# Patient Record
Sex: Female | Born: 1960 | Race: White | Hispanic: No | Marital: Married | Smoking: Never smoker
Health system: Southern US, Community
[De-identification: ages and names within clinical notes are randomized; demographics above are authoritative.]

## PROBLEM LIST (undated history)

## (undated) DIAGNOSIS — H9192 Unspecified hearing loss, left ear: Secondary | ICD-10-CM

## (undated) DIAGNOSIS — M1990 Unspecified osteoarthritis, unspecified site: Secondary | ICD-10-CM

## (undated) DIAGNOSIS — Z9889 Other specified postprocedural states: Secondary | ICD-10-CM

## (undated) DIAGNOSIS — L309 Dermatitis, unspecified: Secondary | ICD-10-CM

## (undated) DIAGNOSIS — R112 Nausea with vomiting, unspecified: Secondary | ICD-10-CM

## (undated) DIAGNOSIS — L509 Urticaria, unspecified: Secondary | ICD-10-CM

## (undated) DIAGNOSIS — I8390 Asymptomatic varicose veins of unspecified lower extremity: Secondary | ICD-10-CM

## (undated) DIAGNOSIS — J189 Pneumonia, unspecified organism: Secondary | ICD-10-CM

## (undated) DIAGNOSIS — K469 Unspecified abdominal hernia without obstruction or gangrene: Secondary | ICD-10-CM

## (undated) DIAGNOSIS — Z889 Allergy status to unspecified drugs, medicaments and biological substances status: Secondary | ICD-10-CM

## (undated) HISTORY — PX: CATARACT EXTRACTION: SUR2

## (undated) HISTORY — PX: ADENOIDECTOMY: SUR15

## (undated) HISTORY — DX: Dermatitis, unspecified: L30.9

## (undated) HISTORY — PX: APPENDECTOMY: SHX54

## (undated) HISTORY — PX: TYMPANOSTOMY TUBE PLACEMENT: SHX32

## (undated) HISTORY — PX: TONSILLECTOMY: SUR1361

## (undated) HISTORY — PX: OTHER SURGICAL HISTORY: SHX169

## (undated) HISTORY — PX: TUBAL LIGATION: SHX77

## (undated) HISTORY — DX: Urticaria, unspecified: L50.9

---

## 2000-01-03 ENCOUNTER — Other Ambulatory Visit: Admission: RE | Admit: 2000-01-03 | Discharge: 2000-01-03 | Payer: Self-pay | Admitting: *Deleted

## 2004-07-15 ENCOUNTER — Emergency Department (HOSPITAL_COMMUNITY): Admission: EM | Admit: 2004-07-15 | Discharge: 2004-07-15 | Payer: Self-pay | Admitting: Family Medicine

## 2004-07-15 IMAGING — CR Imaging study
3 series · 3 of 3 positions shown · non-contrast
Comparison: none

CLINICAL DATA: 43-year-old with left foot injury.  Patient twisted foot this morning.  Patient has dorsal pain and swelling.  
 LEFT FOOT ? 3 VIEWS:
 Three views of the left foot demonstrate soft tissue edema along dorsum of the hind foot.  However no evidence for acute fracture or dislocation.  No metallic foreign body identified.

[view not recorded (1 of 3)]
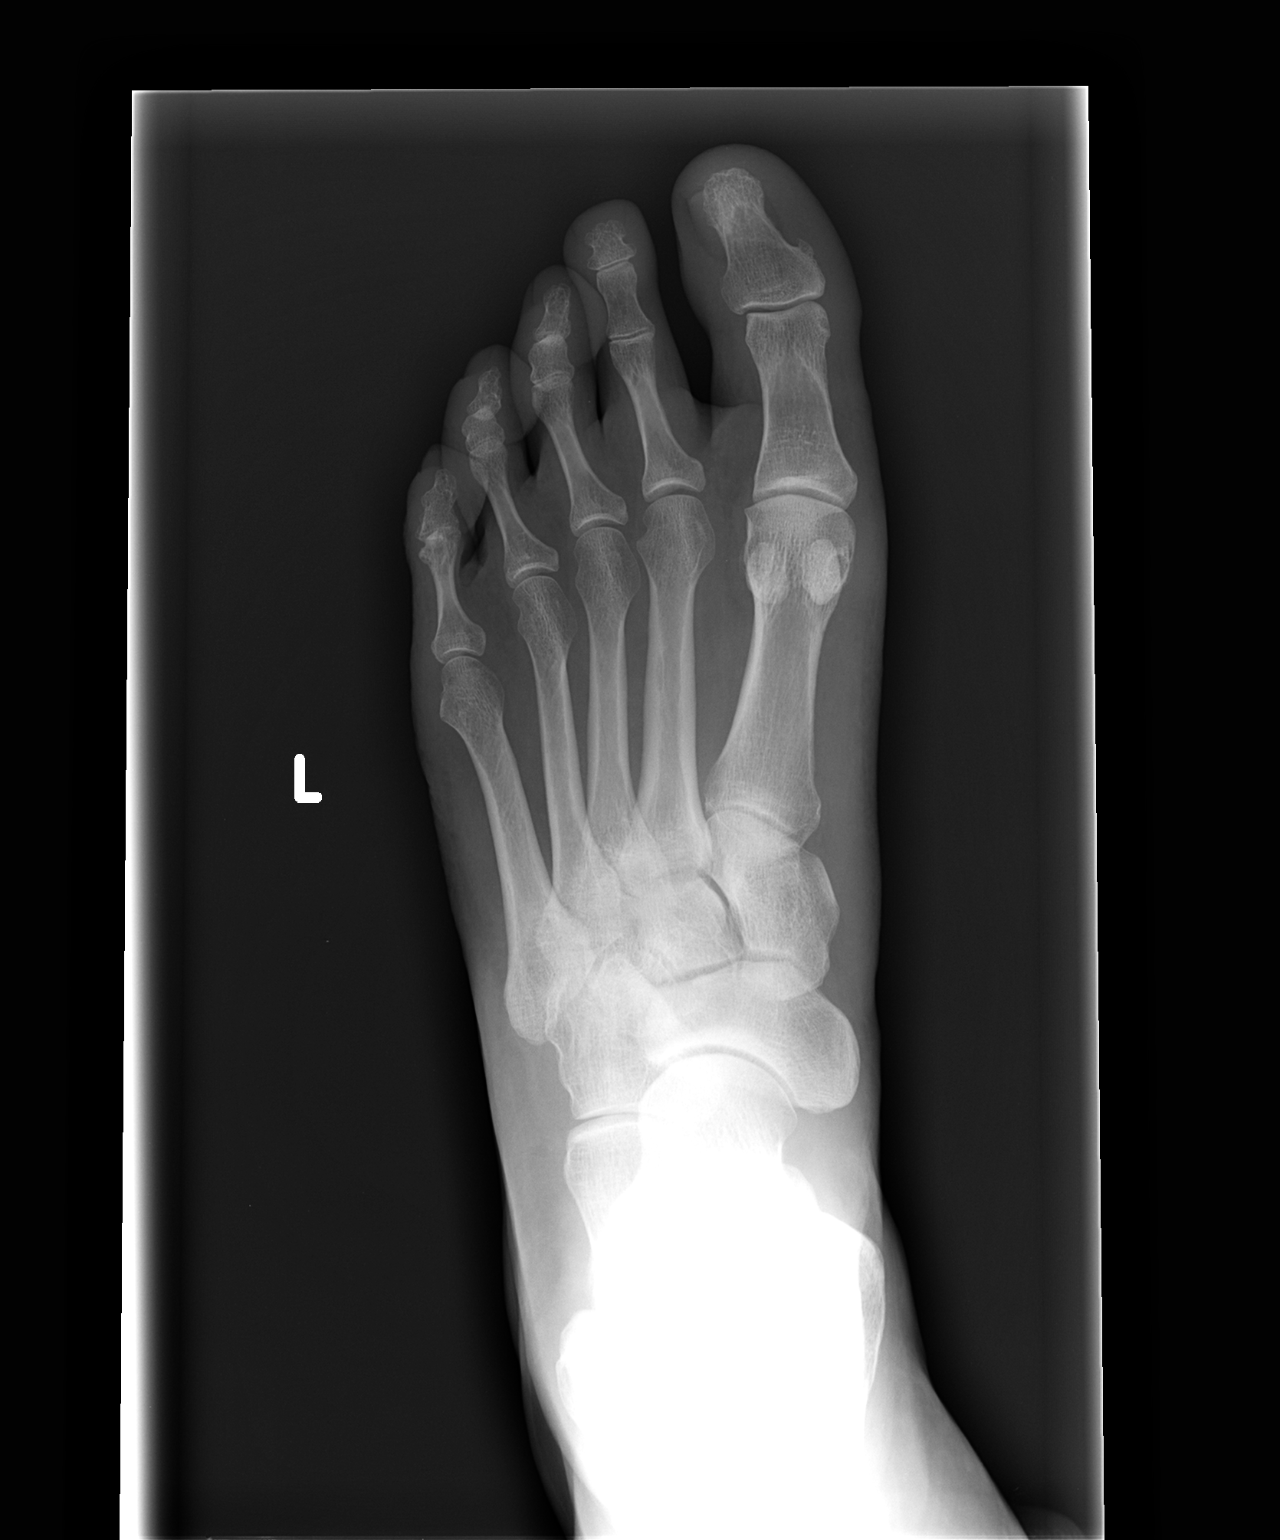

[view not recorded (2 of 3)]
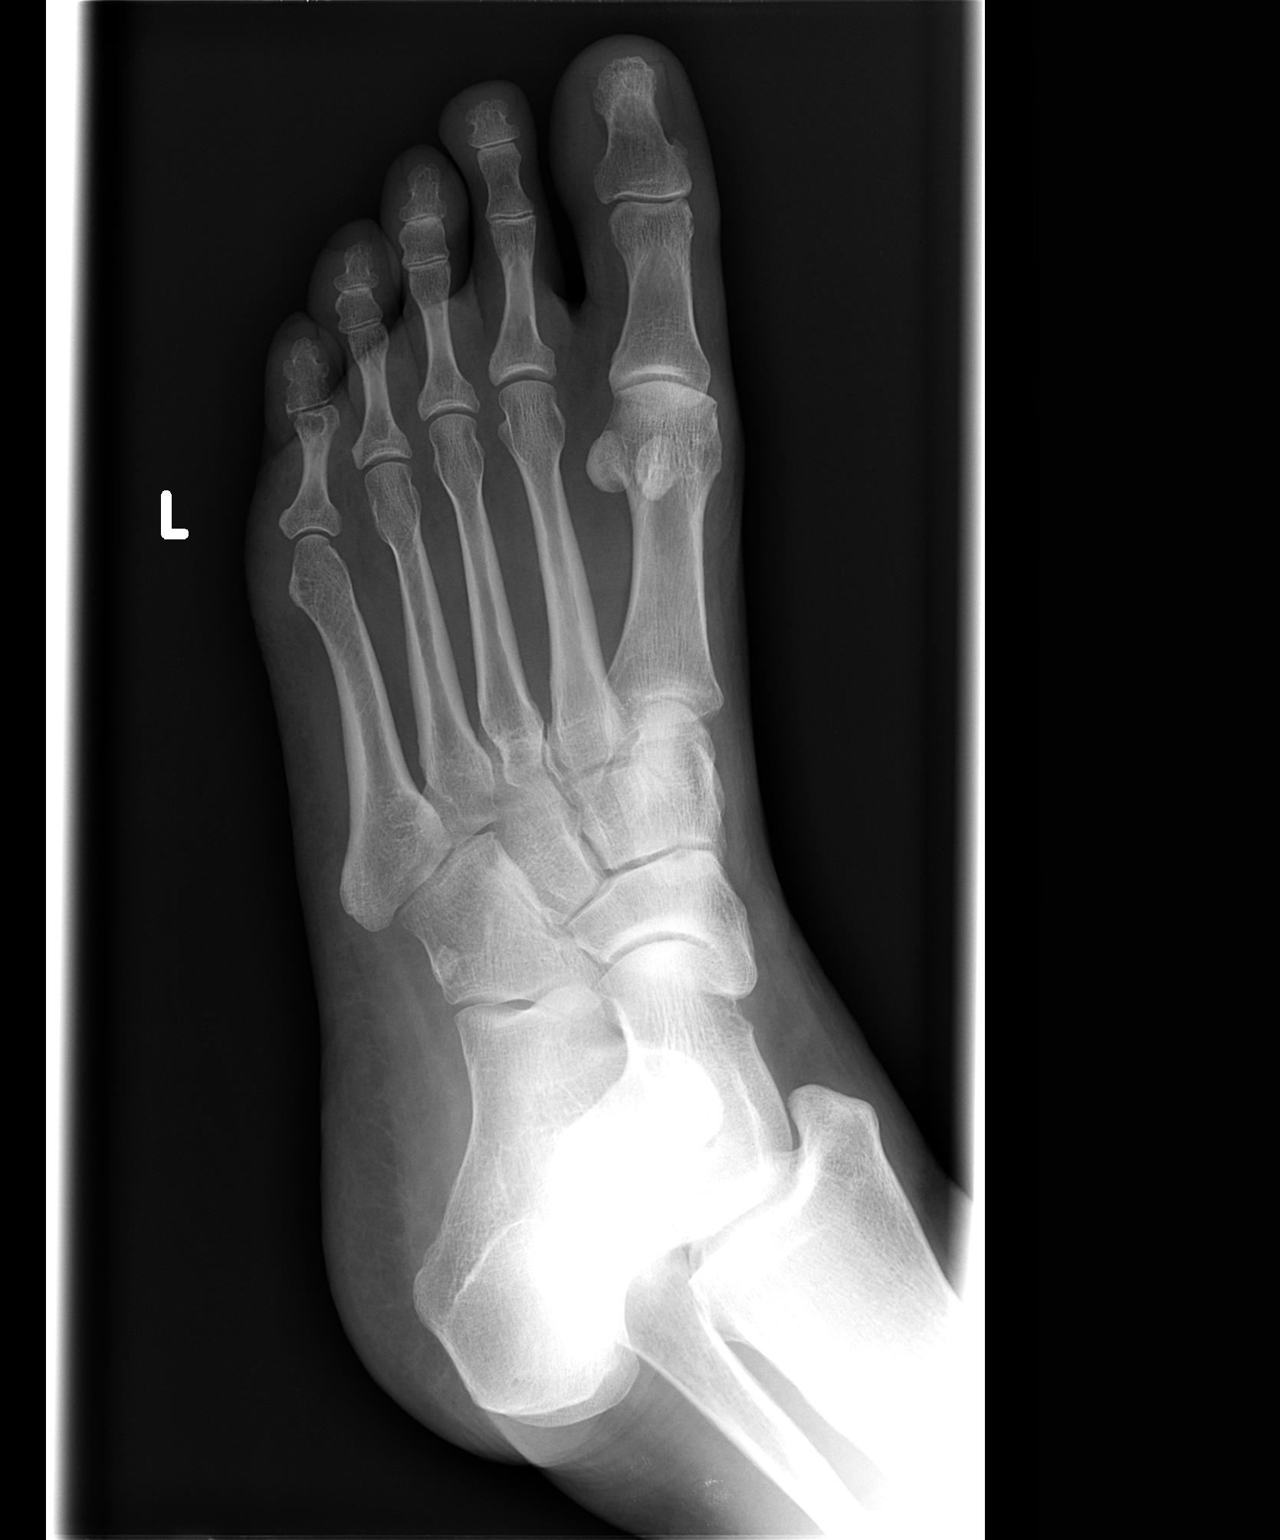

[view not recorded (3 of 3)]
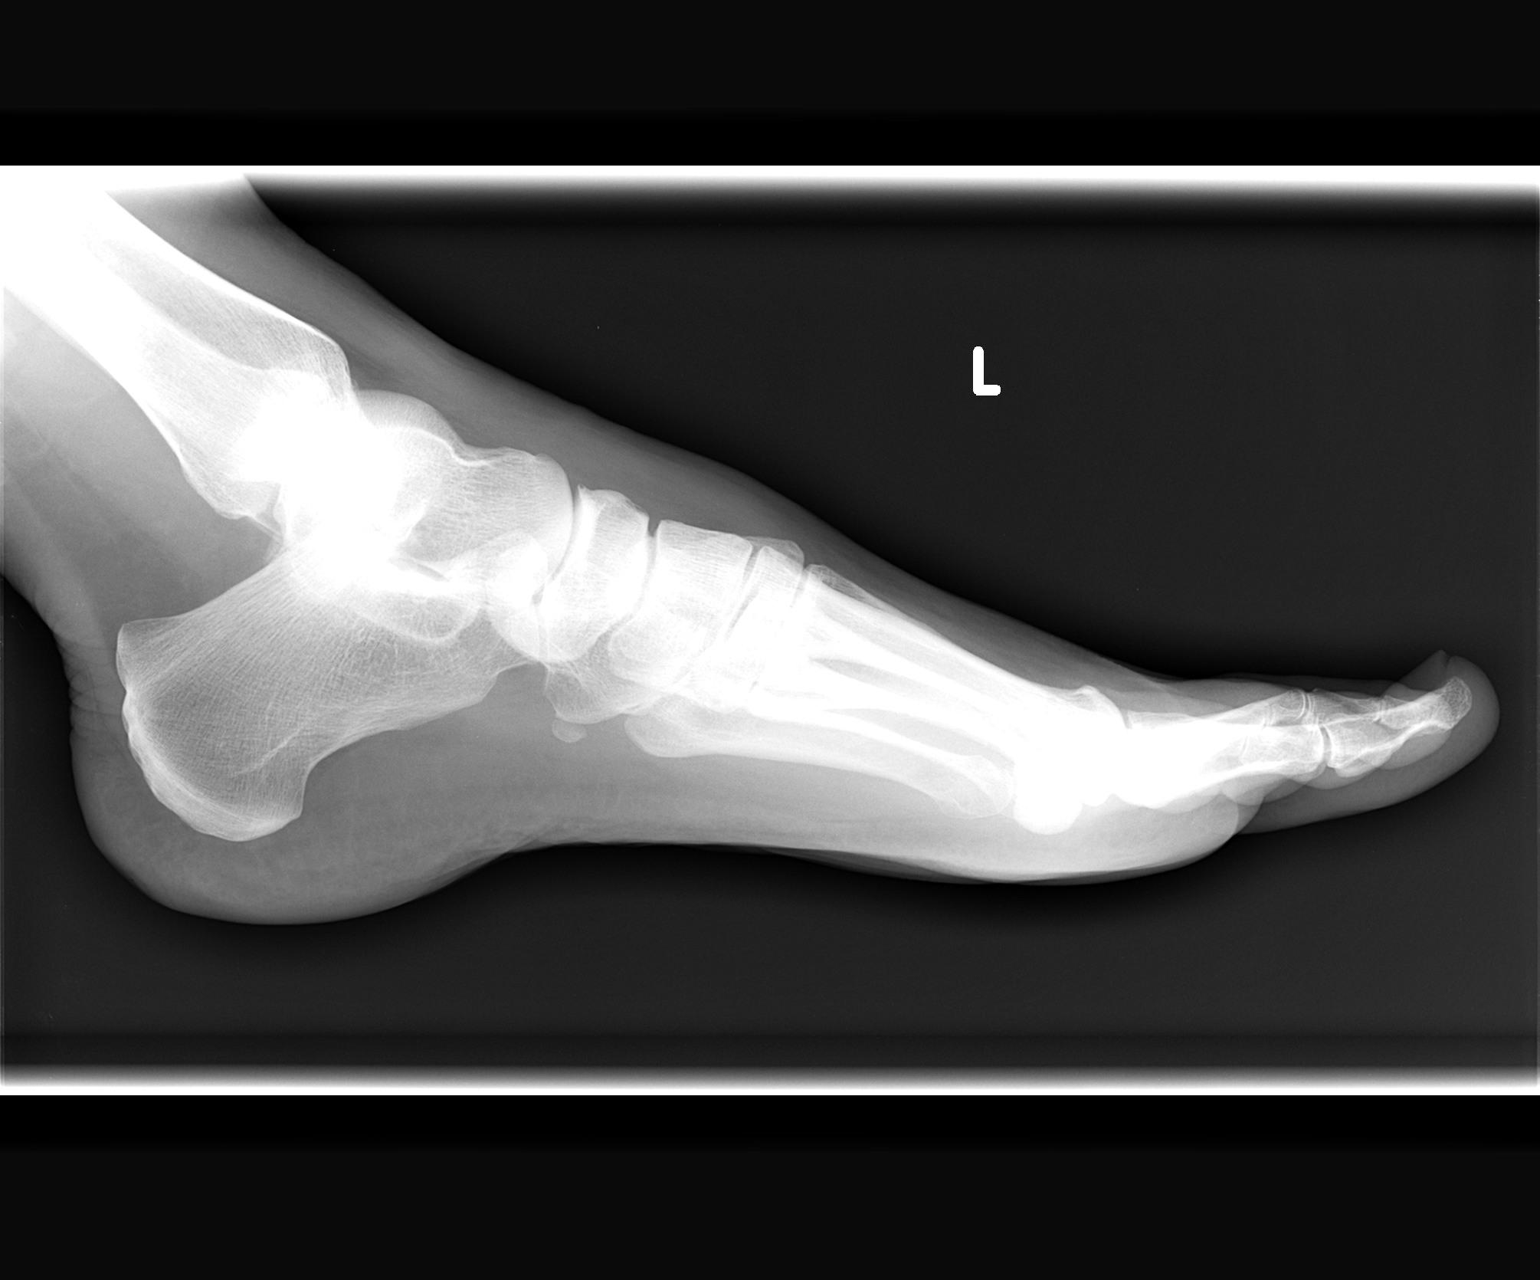

[3 of 3 positions shown; findings below may reference images not displayed]

IMPRESSION: 1.  No evidence for acute bony abnormality. 
 2.  Soft tissue swelling along the dorsum of the hind foot.

## 2004-10-30 ENCOUNTER — Other Ambulatory Visit: Admission: RE | Admit: 2004-10-30 | Discharge: 2004-10-30 | Payer: Self-pay | Admitting: Obstetrics and Gynecology

## 2006-07-16 ENCOUNTER — Other Ambulatory Visit: Admission: RE | Admit: 2006-07-16 | Discharge: 2006-07-16 | Payer: Self-pay | Admitting: Family Medicine

## 2007-11-23 ENCOUNTER — Emergency Department (HOSPITAL_COMMUNITY): Admission: EM | Admit: 2007-11-23 | Discharge: 2007-11-23 | Payer: Self-pay | Admitting: Emergency Medicine

## 2012-03-31 ENCOUNTER — Emergency Department (HOSPITAL_COMMUNITY)
Admission: EM | Admit: 2012-03-31 | Discharge: 2012-04-01 | Disposition: A | Discharge status: Home / Self Care | Payer: BC Managed Care – PPO | Attending: Emergency Medicine | Admitting: Emergency Medicine

## 2012-03-31 ENCOUNTER — Emergency Department (HOSPITAL_COMMUNITY)
Admission: EM | Admit: 2012-03-31 | Discharge: 2012-03-31 | Disposition: A | Discharge status: Home / Self Care | Payer: Self-pay

## 2012-03-31 ENCOUNTER — Encounter (HOSPITAL_COMMUNITY): Payer: Self-pay | Admitting: *Deleted

## 2012-03-31 DIAGNOSIS — Z8719 Personal history of other diseases of the digestive system: Secondary | ICD-10-CM | POA: Insufficient documentation

## 2012-03-31 DIAGNOSIS — R1110 Vomiting, unspecified: Secondary | ICD-10-CM | POA: Insufficient documentation

## 2012-03-31 DIAGNOSIS — Z9089 Acquired absence of other organs: Secondary | ICD-10-CM | POA: Insufficient documentation

## 2012-03-31 DIAGNOSIS — R109 Unspecified abdominal pain: Secondary | ICD-10-CM | POA: Insufficient documentation

## 2012-03-31 HISTORY — DX: Unspecified abdominal hernia without obstruction or gangrene: K46.9

## 2012-03-31 LAB — CBC WITH DIFFERENTIAL/PLATELET
Basophils Absolute: 0 10*3/uL (ref 0.0–0.1)
Hemoglobin: 12.8 g/dL (ref 12.0–15.0)
Lymphs Abs: 1 10*3/uL (ref 0.7–4.0)
MCH: 32 pg (ref 26.0–34.0)
Monocytes Relative: 5 % (ref 3–12)
Neutro Abs: 7.5 10*3/uL (ref 1.7–7.7)
Neutrophils Relative %: 82 % — ABNORMAL HIGH (ref 43–77)
RBC: 4 MIL/uL (ref 3.87–5.11)
RDW: 13.1 % (ref 11.5–15.5)
WBC: 9.1 10*3/uL (ref 4.0–10.5)

## 2012-03-31 LAB — HEPATIC FUNCTION PANEL: Bilirubin, Direct: <0.1 mg/dL (ref 0.0–0.3)

## 2012-03-31 LAB — LIPASE, BLOOD: Lipase: 35 U/L (ref 11–59)

## 2012-03-31 LAB — BASIC METABOLIC PANEL
BUN: 24 mg/dL — ABNORMAL HIGH (ref 6–23)
CO2: 26 mEq/L (ref 19–32)
Creatinine, Ser: 0.86 mg/dL (ref 0.50–1.10)
GFR calc Af Amer: 88 mL/min — ABNORMAL LOW (ref >90)
Glucose, Bld: 153 mg/dL — ABNORMAL HIGH (ref 70–99)

## 2012-03-31 MED ORDER — MORPHINE SULFATE 4 MG/ML IJ SOLN
4.0000 mg | Freq: Once | INTRAMUSCULAR | Status: AC
  Administered 2012-03-31: 4 mg via INTRAVENOUS
  Filled 2012-03-31: qty 1

## 2012-03-31 MED ORDER — ONDANSETRON HCL 4 MG/2ML IJ SOLN
4.0000 mg | Freq: Once | INTRAMUSCULAR | Status: AC
  Administered 2012-03-31: 4 mg via INTRAVENOUS
  Filled 2012-03-31: qty 2

## 2012-03-31 MED ORDER — GI COCKTAIL ~~LOC~~
30.0000 mL | Freq: Once | ORAL | Status: AC
  Administered 2012-04-01: 30 mL via ORAL
  Filled 2012-03-31: qty 30

## 2012-03-31 MED ORDER — SODIUM CHLORIDE 0.9 % IV BOLUS (SEPSIS)
1000.0000 mL | Freq: Once | INTRAVENOUS | Status: AC
  Administered 2012-03-31: 1000 mL via INTRAVENOUS

## 2012-03-31 MED ORDER — PANTOPRAZOLE SODIUM 40 MG IV SOLR
40.0000 mg | Freq: Once | INTRAVENOUS | Status: AC
  Administered 2012-04-01: 40 mg via INTRAVENOUS
  Filled 2012-03-31: qty 40

## 2012-03-31 NOTE — ED Notes (Signed)
Pt reports generalized abd pain that began approx 1900 - pt states she has hx of a hernia and usually the pain spontaneously subsided however tonight the pain will not subside - pt admits to x2 episodes of vomiting denies diarrhea.

## 2012-03-31 NOTE — ED Provider Notes (Signed)
History     CSN: [id number]  Arrival date & time 03/31/12  2133   None     Chief Complaint  Patient presents with  . Abdominal Pain  . Emesis    (Consider location/radiation/quality/duration/timing/severity/associated sxs/prior treatment) HPI History provided by pt.   Pt developed severe peri-umbilical pain that radiated around both sides to her back and was associated w/ 3 episodes of vomiting, immediately after dinner tonight.  Ate a cheeseburger.  Had similar sx one week ago, but pain worse today.  No aggravating/alleviating factors.  Has not had fever, hematemesis/hematochezia/melena, urinary or vaginal sx.  Had a nml BM this morning.  H/o appendectomy.  Has a chronic L inguinal hernia.  Noticed that it had popped out after vomiting today, but it has reduced spontaneously in ED.  Abdominal pain currently improved.  Past Medical History  Diagnosis Date  . Hernia     Past Surgical History  Procedure Laterality Date  . Appendectomy      History reviewed. No pertinent family history.  History  Substance Use Topics  . Smoking status: Never Smoker   . Smokeless tobacco: Not on file  . Alcohol Use: No    OB History   Grav Para Term Preterm Abortions TAB SAB Ect Mult Living                  Review of Systems  All other systems reviewed and are negative.    Allergies  Demerol  Home Medications   Current Outpatient Rx  Name  Route  Sig  Dispense  Refill  . acetaminophen (TYLENOL) 325 MG tablet   Oral   Take 650 mg by mouth every 6 (six) hours as needed for pain. Pain           BP 148/58  Pulse 70  Temp(Src) 98 F (36.7 C) (Oral)  Resp 22  Ht 5\' 11"  (1.803 m)  Wt 164 lb (74.39 kg)  BMI 22.88 kg/m2  SpO2 100%  Physical Exam  Nursing note and vitals reviewed. Constitutional: She is oriented to person, place, and time. She appears well-developed and well-nourished. No distress.  HENT:  Head: Normocephalic and atraumatic.  Eyes:  Normal appearance   Neck: Normal range of motion.  Cardiovascular: Normal rate and regular rhythm.   Pulmonary/Chest: Effort normal and breath sounds normal. No respiratory distress.  Abdominal: Soft. Bowel sounds are normal. She exhibits no distension and no mass. There is tenderness. There is guarding. There is no rebound.  Epigastric and periumbilical ttp w/ guarding.  Negative murphy's sign.   Genitourinary:  No CVA tenderness  Musculoskeletal: Normal range of motion.  Neurological: She is alert and oriented to person, place, and time.  Skin: Skin is warm and dry. No rash noted.  Psychiatric: She has a normal mood and affect. Her behavior is normal.    ED Course  Procedures (including critical care time)  Labs Reviewed  URINALYSIS, MICROSCOPIC ONLY - Abnormal; Notable for the following:    Hgb urine dipstick TRACE (*)    Ketones, ur TRACE (*)    All other components within normal limits  CBC WITH DIFFERENTIAL - Abnormal; Notable for the following:    Neutrophils Relative 82 (*)    Lymphocytes Relative 11 (*)    All other components within normal limits  BASIC METABOLIC PANEL - Abnormal; Notable for the following:    Glucose, Bld 153 (*)    BUN 24 (*)    GFR calc non Af Amer 76 (*)  GFR calc Af Amer 88 (*)    All other components within normal limits  HEPATIC FUNCTION PANEL  LIPASE, BLOOD   US Abdomen Complete  04/01/2012  *RADIOLOGY REPORT*  Clinical Data:  52 year old female with abdominal pain.  ABDOMINAL ULTRASOUND COMPLETE  Comparison:  None  Findings:  Gallbladder:  The gallbladder is unremarkable. There is no evidence of gallstones, gallbladder wall thickening, or pericholecystic fluid.  Common Bile Duct:  There is no evidence of intrahepatic or extrahepatic biliary dilation. The CBD measures 4.4 mm in greatest diameter.  Liver:  The liver is within normal limits in parenchymal echogenicity. No focal abnormalities are identified.  IVC:  Appears normal.  Pancreas:  Although the pancreas  is difficult to visualize in its entirety, no focal pancreatic abnormality is identified.  Spleen:  Within normal limits in size and echotexture.  Right kidney:  The right kidney is normal in size and parenchymal echogenicity.  There is no evidence of solid mass, hydronephrosis or definite renal calculi.  The right kidney measures 11.4 cm.  Left kidney:  The left kidney is normal in size and parenchymal echogenicity.  There is no evidence of solid mass, hydronephrosis or definite renal calculi.   The left kidney measures 10.7 cm.  Abdominal Aorta:  No abdominal aortic aneurysm identified.  There is no evidence of ascites.  IMPRESSION: Negative abdominal ultrasound.   Original Report Authenticated By: Harmon Pier, M.D.      1. Abdominal pain       MDM  51yo F presents w/ severe, post-prandial, peri-umbilical pain w/ N/V that started this evening.  H/o L inguinal hernia and appendectomy.  On exam, afebrile, NAD, periumbilical and epigastric ttp w/ guarding but no peritoneal signs/negative murphy's, and hernia is not palpable.  Labs pending and abd [place] ordered to r/o cholelithiasis.  Sx improved spontaneously but gi cocktail, IV protonix, morphine, zofran and fluids ordered.  Will reassess shortly.  11:58 PM   Pt reports that her pain and nausea have resolved.  She has clarified that the pain seemed to improve when her hernia reduced spontaneously here in ED.  She was not able to manually reduce prior to arrival.  Same occurred when she had this pain last week.  On repeat exam, mild tenderness at RLQ only and hernia is not palpable.   Labs and US unremarkable.  Results discussed w/ pt.  Will po challenge and then d/c home w/ referral to [facility] as well as vicodin and zofran.  Strict return precautions discussed. 2:18 AM         Otilio Miu, PA-C 04/01/12 [phone]

## 2012-03-31 NOTE — ED Notes (Signed)
PA at bedside.

## 2012-04-01 ENCOUNTER — Emergency Department (HOSPITAL_COMMUNITY): Payer: BC Managed Care – PPO

## 2012-04-01 LAB — URINALYSIS, MICROSCOPIC ONLY
Bilirubin Urine: NEGATIVE
Leukocytes, UA: NEGATIVE
Nitrite: NEGATIVE
Specific Gravity, Urine: 1.024 (ref 1.005–1.030)

## 2012-04-01 MED ORDER — HYDROCODONE-ACETAMINOPHEN 5-325 MG PO TABS: 1.0000 | ORAL_TABLET | ORAL | Status: DC | PRN

## 2012-04-01 MED ORDER — ONDANSETRON HCL 8 MG PO TABS: 8.0000 mg | ORAL_TABLET | Freq: Three times a day (TID) | ORAL | Status: DC | PRN

## 2012-04-02 ENCOUNTER — Ambulatory Visit (INDEPENDENT_AMBULATORY_CARE_PROVIDER_SITE_OTHER): Payer: BC Managed Care – PPO | Admitting: General Surgery

## 2012-04-02 ENCOUNTER — Encounter (INDEPENDENT_AMBULATORY_CARE_PROVIDER_SITE_OTHER): Payer: Self-pay | Admitting: General Surgery

## 2012-04-02 VITALS — BP 136/72 | HR 64 | Temp 98.5°F | Resp 12 | Ht 71.0 in | Wt 169.0 lb

## 2012-04-02 DIAGNOSIS — K4090 Unilateral inguinal hernia, without obstruction or gangrene, not specified as recurrent: Secondary | ICD-10-CM

## 2012-04-02 NOTE — Progress Notes (Signed)
Patient ID: Brenda Lynn, female   DOB: [birth year], 52 y.o.   MRN: [id number]  Chief Complaint  Patient presents with  . New Evaluation    eval LIH - very painful    HPI Brenda Lynn is a 52 y.o. female.  This patient is referred by Dr. Nicki Reaper the emergency room for evaluation of a left inguinal hernia. She is at a known inguinal hernia for several years since the birth of her son but it has only cause 3 episodes of discomfort. It normally is asymptomatic and she notices a small bulge periodically but even when it bulges out it doesn't cause any discomfort. She says that 3 episodes have occurred where she had a bulging in the groin and she was unable to reduce the bulge which causes excruciating pain.  These episodes were accompanied with nausea and vomiting as well and relieved after short period of time. Otherwise she denies any obstructive symptoms HPI  Past Medical History  Diagnosis Date  . Hernia     Past Surgical History  Procedure Laterality Date  . Appendectomy    . Tubal ligation    . Tympanostomy tube placement      Family History  Problem Relation Age of Onset  . Cancer Father     leukemia  . Cancer Paternal Aunt     breast  . Cancer Maternal Grandfather     unknown to pt / ?skin    Social History History  Substance Use Topics  . Smoking status: Never Smoker   . Smokeless tobacco: Never Used  . Alcohol Use: No    Allergies  Allergen Reactions  . Demerol (Meperidine) Nausea Only    Current Outpatient Prescriptions  Medication Sig Dispense Refill  . ibuprofen (ADVIL,MOTRIN) 100 MG chewable tablet Chew 100 mg by mouth every 8 (eight) hours as needed for fever.      Marland Kitchen acetaminophen (TYLENOL) 325 MG tablet Take 650 mg by mouth every 6 (six) hours as needed for pain. Pain       No current facility-administered medications for this visit.    Review of Systems Review of Systems All other review of systems negative or noncontributory except as stated in the  HPI  Blood pressure 136/72, pulse 64, temperature 98.5 F (36.9 C), temperature source Temporal, resp. rate 12, height 5\' 11"  (1.803 m), weight 169 lb (76.658 kg).  Physical Exam Physical Exam Physical Exam  Nursing note and vitals reviewed. Constitutional: She is oriented to person, place, and time. She appears well-developed and well-nourished. No distress.  HENT:  Head: Normocephalic and atraumatic.  Mouth/Throat: No oropharyngeal exudate.  Eyes: Conjunctivae and EOM are normal. Pupils are equal, round, and reactive to light. Right eye exhibits no discharge. Left eye exhibits no discharge. No scleral icterus.  Neck: Normal range of motion. Neck supple. No tracheal deviation present.  Cardiovascular: Normal rate, regular rhythm, normal heart sounds and intact distal pulses.   Pulmonary/Chest: Effort normal and breath sounds normal. No stridor. No respiratory distress. She has no wheezes.  Abdominal: Soft. Bowel sounds are normal. She exhibits no distension and no mass. There is no tenderness. There is no rebound and no guarding.  Small left inguinal hernia, no RIH Musculoskeletal: Normal range of motion. She exhibits no edema and no tenderness.  Neurological: She is alert and oriented to person, place, and time.  Skin: Skin is warm and dry. No rash noted. She is not diaphoretic. No erythema. No pallor.  Psychiatric: She has a normal mood  and affect. Her behavior is normal. Judgment and thought content normal.    Data Reviewed   Assessment    A small left inguinal hernia She has a small left inguinal hernia on exam and a history consistent with inguinal hernia as well. She does work for [employer] and does a lot of distinct which could certainly exacerbate her problem. We discussed the options for repair of both laparoscopic and open repair I think that open repair would probably be the best option for this case. We discussed the option of infection, bleeding, pain, scarring, recurrence,  persistent symptoms, nerve injury and chronic pain. She would like to proceed with open LIH with mesh    Plan    We will set her up for open LIH at her convenience        Lodema Pilot DAVID 04/02/2012, 3:15 PM

## 2012-04-02 NOTE — ED Provider Notes (Signed)
Medical screening examination/treatment/procedure(s) were performed by non-physician practitioner and as supervising physician I was immediately available for consultation/collaboration.    Janequa Kipnis R Kayven Aldaco, MD 04/02/12 2208 

## 2012-04-29 ENCOUNTER — Telehealth (INDEPENDENT_AMBULATORY_CARE_PROVIDER_SITE_OTHER): Payer: Self-pay | Admitting: General Surgery

## 2012-04-29 NOTE — Telephone Encounter (Signed)
Patient also wanted to let us know that she has a h/o keloid development.

## 2012-04-29 NOTE — Telephone Encounter (Signed)
Called patient back after speaking with Dr. Biagio Quint and I informed her that he explained he only recalled one on the left side but that he would check her at the time of surgery and make a decision based on the evidence.  She was okay with this.

## 2012-04-29 NOTE — Telephone Encounter (Signed)
Patient called in to get a littler more information about the mesh.  She was worried that it had metal components, informed her that it did not.  She also explained that she has a BIH, but that Dr. Biagio Quint only has her scheduled for a single sided hernia repair and she wants to know if he thinks she should go ahead and have both sides repaired at the same time.  I explained that I would take this to Dr. Biagio Quint and that we would get back to her to let her know.

## 2012-05-01 ENCOUNTER — Encounter (HOSPITAL_COMMUNITY): Payer: Self-pay | Admitting: Pharmacy Technician

## 2012-05-02 NOTE — Telephone Encounter (Signed)
Please advise 

## 2012-05-05 ENCOUNTER — Other Ambulatory Visit (HOSPITAL_COMMUNITY): Payer: Self-pay | Admitting: *Deleted

## 2012-05-06 ENCOUNTER — Encounter (HOSPITAL_COMMUNITY)
Admission: RE | Admit: 2012-05-06 | Discharge: 2012-05-06 | Disposition: A | Discharge status: Home / Self Care | Payer: BC Managed Care – PPO | Source: Ambulatory Visit | Attending: General Surgery | Admitting: General Surgery

## 2012-05-06 ENCOUNTER — Encounter (HOSPITAL_COMMUNITY): Payer: Self-pay

## 2012-05-06 VITALS — BP 122/78 | HR 70 | Temp 98.2°F | Resp 18 | Ht 70.5 in | Wt 167.0 lb

## 2012-05-06 DIAGNOSIS — K4090 Unilateral inguinal hernia, without obstruction or gangrene, not specified as recurrent: Secondary | ICD-10-CM

## 2012-05-06 LAB — SURGICAL PCR SCREEN: MRSA, PCR: NEGATIVE

## 2012-05-06 LAB — CBC
HCT: 39.6 % (ref 36.0–46.0)
Hemoglobin: 13.3 g/dL (ref 12.0–15.0)
MCHC: 33.6 g/dL (ref 30.0–36.0)
MCV: 96.1 fL (ref 78.0–100.0)
WBC: 3.4 10*3/uL — ABNORMAL LOW (ref 4.0–10.5)

## 2012-05-06 NOTE — Patient Instructions (Addendum)
20 Shaylan Tutton  05/06/2012   Your procedure is scheduled on: 05-14-2012   Report to [facility] at 0630 AM.  Call this number if you have problems the morning of surgery [phone]   Remember:   Do not eat food or drink liquids :After Midnight.     Take these medicines the morning of surgery with A SIP OF WATER: no meds to take                                SEE [facility] PREPARING FOR SURGERY SHEET   Do not wear jewelry, make-up or nail polish.  Do not wear lotions, powders, or perfumes. You may wear deodorant.   Men may shave face and neck.  Do not bring valuables to the hospital.  Contacts, dentures or bridgework may not be worn into surgery.  Leave suitcase in the car. After surgery it may be brought to your room.  For patients admitted to the hospital, checkout time is 11:00 AM the day of discharge.   Patients discharged the day of surgery will not be allowed to drive home.  Name and phone number of your driver: caral whan spouse cell [phone]  Special Instructions: N/A   Please read over the following fact sheets that you were given: MRSA Information.  Call Cain Sieve RN pre op nurse if needed [phone]    FAILURE TO FOLLOW THESE INSTRUCTIONS MAY RESULT IN THE CANCELLATION OF YOUR SURGERY. PATIENT SIGNATURE___________________________________________

## 2012-05-13 NOTE — Anesthesia Preprocedure Evaluation (Addendum)
Anesthesia Evaluation  Patient identified by MRN, date of birth, ID band Patient awake    Reviewed: Allergy & Precautions, H&P , NPO status , Patient's Chart, lab work & pertinent test results  Airway Mallampati: II      Dental  (+) Teeth Intact and Dental Advisory Given   Pulmonary neg pulmonary ROS,  breath sounds clear to auscultation  Pulmonary exam normal       Cardiovascular negative cardio ROS  Rhythm:Regular Rate:Normal     Neuro/Psych negative neurological ROS  negative psych ROS   GI/Hepatic negative GI ROS, Neg liver ROS,   Endo/Other  negative endocrine ROS  Renal/GU negative Renal ROS  negative genitourinary   Musculoskeletal negative musculoskeletal ROS (+)   Abdominal   Peds  Hematology negative hematology ROS (+)   Anesthesia Other Findings   Reproductive/Obstetrics negative OB ROS                          Anesthesia Physical Anesthesia Plan  ASA: I  Anesthesia Plan: General   Post-op Pain Management:    Induction: Intravenous  Airway Management Planned: LMA  Additional Equipment:   Intra-op Plan:   Post-operative Plan: Extubation in OR  Informed Consent: I have reviewed the patients History and Physical, chart, labs and discussed the procedure including the risks, benefits and alternatives for the proposed anesthesia with the patient or authorized representative who has indicated his/her understanding and acceptance.   Dental advisory given  Plan Discussed with: CRNA  Anesthesia Plan Comments:         Anesthesia Quick Evaluation

## 2012-05-14 ENCOUNTER — Ambulatory Visit (HOSPITAL_COMMUNITY): Payer: BC Managed Care – PPO | Admitting: Anesthesiology

## 2012-05-14 ENCOUNTER — Ambulatory Visit (HOSPITAL_COMMUNITY)
Admission: RE | Admit: 2012-05-14 | Discharge: 2012-05-14 | Disposition: A | Discharge status: Home / Self Care | Payer: BC Managed Care – PPO | Source: Ambulatory Visit | Attending: General Surgery | Admitting: General Surgery

## 2012-05-14 ENCOUNTER — Encounter (HOSPITAL_COMMUNITY): Payer: Self-pay | Admitting: Anesthesiology

## 2012-05-14 ENCOUNTER — Encounter (HOSPITAL_COMMUNITY)
Admission: RE | Disposition: A | Discharge status: Home / Self Care | Payer: Self-pay | Source: Ambulatory Visit | Attending: General Surgery

## 2012-05-14 ENCOUNTER — Encounter (HOSPITAL_COMMUNITY): Payer: Self-pay

## 2012-05-14 DIAGNOSIS — K4090 Unilateral inguinal hernia, without obstruction or gangrene, not specified as recurrent: Secondary | ICD-10-CM | POA: Insufficient documentation

## 2012-05-14 DIAGNOSIS — Z01812 Encounter for preprocedural laboratory examination: Secondary | ICD-10-CM | POA: Insufficient documentation

## 2012-05-14 HISTORY — PX: INSERTION OF MESH: SHX5868

## 2012-05-14 HISTORY — PX: INGUINAL HERNIA REPAIR: SHX194

## 2012-05-14 SURGERY — REPAIR, HERNIA, INGUINAL, ADULT
Anesthesia: General | Site: Groin | Laterality: Left | Wound class: Clean

## 2012-05-14 MED ORDER — HYDROMORPHONE HCL PF 1 MG/ML IJ SOLN
0.2500 mg | INTRAMUSCULAR | Status: DC | PRN
  Administered 2012-05-14 (×2): 0.5 mg via INTRAVENOUS

## 2012-05-14 MED ORDER — LIDOCAINE HCL (CARDIAC) 20 MG/ML IV SOLN
INTRAVENOUS | Status: DC | PRN
  Administered 2012-05-14: 50 mg via INTRAVENOUS

## 2012-05-14 MED ORDER — ACETAMINOPHEN 10 MG/ML IV SOLN
INTRAVENOUS | Status: DC | PRN
  Administered 2012-05-14: 1000 mg via INTRAVENOUS

## 2012-05-14 MED ORDER — FENTANYL CITRATE 0.05 MG/ML IJ SOLN
INTRAMUSCULAR | Status: DC | PRN
  Administered 2012-05-14: 100 ug via INTRAVENOUS
  Administered 2012-05-14 (×3): 50 ug via INTRAVENOUS

## 2012-05-14 MED ORDER — HYDROMORPHONE HCL PF 1 MG/ML IJ SOLN
INTRAMUSCULAR | Status: AC
  Filled 2012-05-14: qty 1

## 2012-05-14 MED ORDER — LACTATED RINGERS IV SOLN: INTRAVENOUS | Status: DC

## 2012-05-14 MED ORDER — CEFAZOLIN SODIUM-DEXTROSE 2-3 GM-% IV SOLR
2.0000 g | INTRAVENOUS | Status: AC
  Administered 2012-05-14: 2 g via INTRAVENOUS

## 2012-05-14 MED ORDER — LIDOCAINE-EPINEPHRINE 1 %-1:100000 IJ SOLN
INTRAMUSCULAR | Status: AC
  Filled 2012-05-14: qty 1

## 2012-05-14 MED ORDER — BUPIVACAINE HCL (PF) 0.25 % IJ SOLN
INTRAMUSCULAR | Status: AC
  Filled 2012-05-14: qty 30

## 2012-05-14 MED ORDER — DEXAMETHASONE SODIUM PHOSPHATE 10 MG/ML IJ SOLN
INTRAMUSCULAR | Status: DC | PRN
  Administered 2012-05-14: 10 mg via INTRAVENOUS

## 2012-05-14 MED ORDER — MIDAZOLAM HCL 5 MG/5ML IJ SOLN
INTRAMUSCULAR | Status: DC | PRN
  Administered 2012-05-14: 2 mg via INTRAVENOUS

## 2012-05-14 MED ORDER — PROMETHAZINE HCL 25 MG/ML IJ SOLN: 6.2500 mg | INTRAMUSCULAR | Status: DC | PRN

## 2012-05-14 MED ORDER — PROPOFOL 10 MG/ML IV BOLUS
INTRAVENOUS | Status: DC | PRN
  Administered 2012-05-14: 180 mg via INTRAVENOUS

## 2012-05-14 MED ORDER — LIDOCAINE-EPINEPHRINE 1 %-1:100000 IJ SOLN
INTRAMUSCULAR | Status: DC | PRN
  Administered 2012-05-14: 20 mL

## 2012-05-14 MED ORDER — BUPIVACAINE HCL (PF) 0.25 % IJ SOLN
INTRAMUSCULAR | Status: DC | PRN
  Administered 2012-05-14: 20 mL

## 2012-05-14 MED ORDER — 0.9 % SODIUM CHLORIDE (POUR BTL) OPTIME
TOPICAL | Status: DC | PRN
  Administered 2012-05-14: 1000 mL

## 2012-05-14 MED ORDER — ACETAMINOPHEN 10 MG/ML IV SOLN
INTRAVENOUS | Status: AC
  Filled 2012-05-14: qty 100

## 2012-05-14 MED ORDER — LACTATED RINGERS IV SOLN
INTRAVENOUS | Status: DC | PRN
  Administered 2012-05-14: 08:00:00 via INTRAVENOUS

## 2012-05-14 MED ORDER — CEFAZOLIN SODIUM-DEXTROSE 2-3 GM-% IV SOLR
INTRAVENOUS | Status: AC
  Filled 2012-05-14: qty 50

## 2012-05-14 MED ORDER — ONDANSETRON HCL 4 MG/2ML IJ SOLN
INTRAMUSCULAR | Status: DC | PRN
  Administered 2012-05-14: 4 mg via INTRAVENOUS

## 2012-05-14 MED ORDER — HYDROCODONE-ACETAMINOPHEN 5-325 MG PO TABS: 1.0000 | ORAL_TABLET | Freq: Four times a day (QID) | ORAL | Status: DC | PRN

## 2012-05-14 SURGICAL SUPPLY — 32 items
BENZOIN TINCTURE PRP APPL 2/3 (GAUZE/BANDAGES/DRESSINGS) ×3 IMPLANT
BLADE SURG SZ10 CARB STEEL (BLADE) IMPLANT
CHLORAPREP W/TINT 26ML (MISCELLANEOUS) ×3 IMPLANT
CLOTH BEACON ORANGE TIMEOUT ST (SAFETY) ×3 IMPLANT
DECANTER SPIKE VIAL GLASS SM (MISCELLANEOUS) ×3 IMPLANT
DERMABOND ADVANCED (GAUZE/BANDAGES/DRESSINGS) ×1
DERMABOND ADVANCED .7 DNX12 (GAUZE/BANDAGES/DRESSINGS) ×2 IMPLANT
DISSECTOR ROUND CHERRY 3/8 STR (MISCELLANEOUS) ×3 IMPLANT
DRAIN PENROSE 18X1/2 LTX STRL (DRAIN) IMPLANT
DRAPE LAPAROTOMY TRNSV 102X78 (DRAPE) ×3 IMPLANT
DRAPE UTILITY XL STRL (DRAPES) ×3 IMPLANT
DRSG TEGADERM 4X4.75 (GAUZE/BANDAGES/DRESSINGS) IMPLANT
ELECT CAUTERY BLADE 6.4 (BLADE) ×3 IMPLANT
ELECT REM PT RETURN 9FT ADLT (ELECTROSURGICAL) ×3
ELECTRODE REM PT RTRN 9FT ADLT (ELECTROSURGICAL) ×2 IMPLANT
GLOVE BIO SURGEON STRL SZ7.5 (GLOVE) ×3 IMPLANT
GLOVE SURG SS PI 7.5 STRL IVOR (GLOVE) ×6 IMPLANT
GOWN STRL NON-REIN LRG LVL3 (GOWN DISPOSABLE) ×3 IMPLANT
GOWN STRL REIN XL XLG (GOWN DISPOSABLE) ×6 IMPLANT
KIT BASIN OR (CUSTOM PROCEDURE TRAY) ×3 IMPLANT
MESH ULTRAPRO 3X6 7.6X15CM (Mesh General) ×3 IMPLANT
NEEDLE HYPO 22GX1.5 SAFETY (NEEDLE) ×3 IMPLANT
PACK GENERAL/GYN (CUSTOM PROCEDURE TRAY) ×3 IMPLANT
STRIP CLOSURE SKIN 1/2X4 (GAUZE/BANDAGES/DRESSINGS) IMPLANT
SUT MNCRL AB 4-0 PS2 18 (SUTURE) ×3 IMPLANT
SUT PROLENE 2 0 BLUE (SUTURE) ×9 IMPLANT
SUT VIC AB 2-0 SH 27 (SUTURE) ×3
SUT VIC AB 2-0 SH 27X BRD (SUTURE) ×6 IMPLANT
SUT VIC AB 3-0 SH 27 (SUTURE) ×1
SUT VIC AB 3-0 SH 27X BRD (SUTURE) ×2 IMPLANT
SYR CONTROL 10ML LL (SYRINGE) ×3 IMPLANT
TOWEL OR 17X26 10 PK STRL BLUE (TOWEL DISPOSABLE) ×3 IMPLANT

## 2012-05-14 NOTE — Anesthesia Postprocedure Evaluation (Signed)
  Anesthesia Post-op Note  Patient: Brenda Lynn  Procedure(s) Performed: Procedure(s): HERNIA REPAIR INGUINAL ADULT (Left) INSERTION OF MESH (Left)  Patient Location: PACU  Anesthesia Type:General  Level of Consciousness: awake, alert , oriented and patient cooperative  Airway and Oxygen Therapy: Patient Spontanous Breathing  Post-op Pain: mild  Post-op Assessment: Post-op Vital signs reviewed and Patient's Cardiovascular Status Stable  Post-op Vital Signs: Reviewed and stable  Complications: No apparent anesthesia complications

## 2012-05-14 NOTE — Brief Op Note (Signed)
05/14/2012  10:11 AM  PATIENT:  Brenda Lynn  52 y.o. female  PRE-OPERATIVE DIAGNOSIS:  left inguinal hernia  POST-OPERATIVE DIAGNOSIS:  left inguinal hernia  PROCEDURE:  Procedure(s): HERNIA REPAIR INGUINAL ADULT (Left) INSERTION OF MESH (Left)  SURGEON:  Surgeon(s) and Role:    * Lodema Pilot, DO - Primary  PHYSICIAN ASSISTANT:   ASSISTANTS: none   ANESTHESIA:   general  EBL:  Total I/O In: 1300 [I.V.:1300] Out: -   BLOOD ADMINISTERED:none  DRAINS: none   LOCAL MEDICATIONS USED:  MARCAINE    and LIDOCAINE   SPECIMEN:  No Specimen  DISPOSITION OF SPECIMEN:  N/A  COUNTS:  YES  TOURNIQUET:  * No tourniquets in log *  DICTATION: .Other Dictation: Dictation Number [id number]  PLAN OF CARE: Discharge to home after PACU  PATIENT DISPOSITION:  PACU - hemodynamically stable.   Delay start of Pharmacological VTE agent (>24hrs) due to surgical blood loss or risk of bleeding: no

## 2012-05-14 NOTE — Transfer of Care (Signed)
Immediate Anesthesia Transfer of Care Note  Patient: Brenda Lynn  Procedure(s) Performed: Procedure(s): HERNIA REPAIR INGUINAL ADULT (Left) INSERTION OF MESH (Left)  Patient Location: PACU  Anesthesia Type:General  Level of Consciousness: awake, alert  and oriented  Airway & Oxygen Therapy: Patient Spontanous Breathing and Patient connected to face mask oxygen  Post-op Assessment: Report given to PACU RN and Post -op Vital signs reviewed and stable  Post vital signs: Reviewed and stable  Complications: No apparent anesthesia complications

## 2012-05-14 NOTE — H&P (Signed)
Brenda Lynn is a 52 y.o. female. This patient is referred by Dr. Nicki Reaper the emergency room for evaluation of a left inguinal hernia. She is at a known inguinal hernia for several years since the birth of her son but it has only cause 3 episodes of discomfort. It normally is asymptomatic and she notices a small bulge periodically but even when it bulges out it doesn't cause any discomfort. She says that 3 episodes have occurred where she had a bulging in the groin and she was unable to reduce the bulge which causes excruciating pain. These episodes were accompanied with nausea and vomiting as well and relieved after short period of time. Otherwise she denies any obstructive symptoms.  She denies any changes since our last visit.  She says that it is not bulging out currently HPI  Past Medical History   Diagnosis  Date   .  Hernia     Past Surgical History   Procedure  Laterality  Date   .  Appendectomy     .  Tubal ligation     .  Tympanostomy tube placement      Family History   Problem  Relation  Age of Onset   .  Cancer  Father      leukemia   .  Cancer  Paternal Aunt      breast   .  Cancer  Maternal Grandfather      unknown to pt / ?skin   Social History  History   Substance Use Topics   .  Smoking status:  Never Smoker   .  Smokeless tobacco:  Never Used   .  Alcohol Use:  No    Allergies   Allergen  Reactions   .  Demerol (Meperidine)  Nausea Only    Current Outpatient Prescriptions   Medication  Sig  Dispense  Refill   .  ibuprofen (ADVIL,MOTRIN) 100 MG chewable tablet  Chew 100 mg by mouth every 8 (eight) hours as needed for fever.     Marland Kitchen  acetaminophen (TYLENOL) 325 MG tablet  Take 650 mg by mouth every 6 (six) hours as needed for pain. Pain      No current facility-administered medications for this visit.   Review of Systems  Review of Systems  All other review of systems negative or noncontributory except as stated in the HPI  Wt Readings from Last 3 Encounters:   05/06/12 167 lb (75.751 kg)  04/02/12 169 lb (76.658 kg)  03/31/12 164 lb (74.39 kg)   Temp Readings from Last 3 Encounters:  05/14/12 97.9 F (36.6 C)   05/14/12 97.9 F (36.6 C)   05/06/12 98.2 F (36.8 C) Oral   BP Readings from Last 3 Encounters:  05/14/12 132/81  05/14/12 132/81  05/06/12 122/78   Pulse Readings from Last 3 Encounters:  05/14/12 50  05/14/12 50  05/06/12 70    Physical Exam  Physical Exam  Physical Exam  Nursing note and vitals reviewed.  Constitutional: She is oriented to person, place, and time. She appears well-developed and well-nourished. No distress.  HENT:  Head: Normocephalic and atraumatic.  Mouth/Throat: No oropharyngeal exudate.  Eyes: Conjunctivae and EOM are normal. Pupils are equal, round, and reactive to light. Right eye exhibits no discharge. Left eye exhibits no discharge. No scleral icterus.  Neck: Normal range of motion. Neck supple. No tracheal deviation present.  Cardiovascular: Normal rate, regular rhythm, normal heart sounds and intact distal pulses.  Pulmonary/Chest: Effort normal  and breath sounds normal. No stridor. No respiratory distress. She has no wheezes.  Abdominal: Soft. Bowel sounds are normal. She exhibits no distension and no mass. There is no tenderness. There is no rebound and no guarding. Small left inguinal hernia, no RIH  Musculoskeletal: Normal range of motion. She exhibits no edema and no tenderness.  Neurological: She is alert and oriented to person, place, and time.  Skin: Skin is warm and dry. No rash noted. She is not diaphoretic. No erythema. No pallor.  Psychiatric: She has a normal mood and affect. Her behavior is normal. Judgment and thought content normal.  Data Reviewed  Assessment  small left inguinal hernia  She was seen and evaluated in the preop area.  Site marked with the patient. We discussed the option of infection, bleeding, pain, scarring, recurrence, persistent symptoms, nerve injury and  chronic pain. She would like to proceed with open LIH with mesh

## 2012-05-15 ENCOUNTER — Encounter (HOSPITAL_COMMUNITY): Payer: Self-pay | Admitting: General Surgery

## 2012-05-15 NOTE — Op Note (Signed)
[name], Lynn NO.:  [id number]  MEDICAL RECORD NO.:  [id number]  LOCATION:  [facility]                         FACILITY:  [facility]  PHYSICIAN:  Lodema Pilot, MD       DATE OF BIRTH:  1961-01-17  DATE OF PROCEDURE:  05/14/2012 DATE OF DISCHARGE:  05/14/2012                              OPERATIVE REPORT   PROCEDURE:  Open left inguinal hernia repair with mesh.  PREOPERATIVE DIAGNOSIS:  Left inguinal hernia.  POSTOPERATIVE DIAGNOSIS:  Left inguinal hernia.  SURGEON:  Lodema Pilot, MD  ASSISTANT:  None.  ANESTHESIA:  General LMA anesthesia with 40 mL of 1% lidocaine with epinephrine and 0.25% Marcaine in a 50:50 mixture.  FLUIDS:  1300 mL of crystalloid.  ESTIMATED BLOOD LOSS:  Minimal.  DRAINS:  None.  SPECIMENS:  None.  COMPLICATIONS:  None apparent.  FINDINGS:  Chronic indirect hernia, and placement of a 3-inch x 6-inch UltraPro mesh.  INDICATION FOR PROCEDURE:  Ms. Brenda Lynn is a 52 year old female with a longstanding history of left inguinal hernia.  She has had some painful episodes of bulging in the area and desires repair of her left inguinal hernia.  OPERATIVE DETAILS:  [name] was seen and evaluated in the preoperative area and risks and benefits of the procedure were again discussed in lay terms.  Informed consent was obtained.  Surgical site was marked prior to anesthetic administration.  She was given prophylactic antibiotics and taken to the operating room, placed on the table in supine position.  General LMA anesthesia was obtained and the area was prepped and draped in a standard surgical fashion.  Procedure time-out was performed with all operative team members to confirm proper patient, procedure, and oblique incision was made over the left inguinal canal.  The dissection carried down through the subcutaneous tissue using Bovie electrocautery.  The external oblique fascia was sharply incised and opened along the length of its  fibers.  I dissected, blood leaked around the round ligament, the hernia sac, and she had some clear neurovascular structures and these were preserved throughout the case. The hernia sac was identified and opened and ended the hernia sac to ensure there was no intra-abdominal contents which were involved at the proximal portion of the hernia sac.  There was some thickening of the peritoneum and hernia sac which I thought might represent some intestinal contents, so I divided the hernia sac distal to this level and care to avoid injury to this area of possible intestine.  No ligation of the sac was performed with a 2-0 Vicryl suture and the hernia sac was placed back in the abdomen and the internal ring was closed with 2-0 Vicryl sutures.  The distal hernia sac was resected and separated from the round ligament.  This left the round ligaments and its vascular supply, and I attempted to preserve this.  The remainder of the sac was removed and the wound was irrigated with sterile saline solution and noted to be hemostatic.  I then cut a 3 inch x 6-inch UltraPro mesh to fit the inguinal canal and sutured this and placed at the pubic tubercle.  A few interrupted 2-0 Prolene sutures were used to suture this  to the shelving edge of the inguinal ligament and round ligament exited underneath via mesh between these sutures.  It was sutured medially and cephalad and laterally as well, with interrupted 2- 0 Prolene sutures, take care to avoid injury to the neurovascular structures.  These was covered by the internal ring and the mesh appeared to lay flatten.  The wound was irrigated again with sterile saline solution again noted to be hemostatic and the wound was injected with a total of 40 mL of 1% lidocaine with epinephrine and 0.25% Marcaine in a 50:50 mixture.  The external oblique fascia was then approximated with 2-0 Vicryl suture and Scarpa's fascia was approximated with a running 3-0 Vicryl  suture.  A 4-0 Monocryl suture was used to approximate the skin edges and Dermabond was applied.  All sponge, needle, and instrument counts were correct at the end of the case.  The patient tolerated the procedure well without apparent complications.          ______________________________ Lodema Pilot, MD     BL/MEDQ  D:  05/14/2012  T:  05/14/2012  Job:  [id number]

## 2012-05-30 ENCOUNTER — Ambulatory Visit (INDEPENDENT_AMBULATORY_CARE_PROVIDER_SITE_OTHER): Payer: BC Managed Care – PPO | Admitting: General Surgery

## 2012-05-30 VITALS — BP 126/74 | HR 63 | Temp 98.0°F | Resp 18 | Ht 71.0 in | Wt 163.0 lb

## 2012-05-30 DIAGNOSIS — Z5189 Encounter for other specified aftercare: Secondary | ICD-10-CM

## 2012-05-30 DIAGNOSIS — Z4889 Encounter for other specified surgical aftercare: Secondary | ICD-10-CM

## 2012-05-30 NOTE — Progress Notes (Signed)
Subjective:     Patient ID: Brenda Lynn, female   DOB: [birth year], 52 y.o.   MRN: [id number]  HPI This patient follows up status post open repair of left inguinal hernia with mesh. She really has no complaints although she did have some significant discomfort postoperatively. She is pretty much off the pain medications except for at night she will take one pain pill before going to bed because she has some muscle spasms and cramping in the area. She has some small areas of numbness in the area as well but has not noticed any bulge. She says that she is tolerating a diet and her bowels returned normal.  Review of Systems     Objective:   Physical Exam Her incision looks fine without any evidence of infection. There is a normal healing ridge without any sign of recurrent hernia with Valsalva    Assessment:     Status post open repair of left inguinal hernia I think that she is doing fine from her surgery. There is no evidence of recurrent hernia with Valsalva.     Plan:     Continue with light duty for another few weeks and then she can gradually increase her activity as tolerated. She does work for [employer] and does a lot of lifting and her disability currently has her out until June.  She's going to go ahead and stick with this plan but I told her that if she would like to return to work earlier, and she is pain free, then she can call me and I would be happy to release her to work earlier

## 2012-06-20 ENCOUNTER — Encounter (INDEPENDENT_AMBULATORY_CARE_PROVIDER_SITE_OTHER): Payer: Self-pay | Admitting: General Surgery

## 2012-06-20 ENCOUNTER — Ambulatory Visit (INDEPENDENT_AMBULATORY_CARE_PROVIDER_SITE_OTHER): Payer: BC Managed Care – PPO | Admitting: General Surgery

## 2012-06-20 ENCOUNTER — Encounter (INDEPENDENT_AMBULATORY_CARE_PROVIDER_SITE_OTHER): Payer: Self-pay

## 2012-06-20 VITALS — BP 128/76 | HR 72 | Temp 98.2°F | Resp 18 | Ht 71.0 in | Wt 167.0 lb

## 2012-06-20 DIAGNOSIS — Z5189 Encounter for other specified aftercare: Secondary | ICD-10-CM

## 2012-06-20 DIAGNOSIS — Z4889 Encounter for other specified surgical aftercare: Secondary | ICD-10-CM

## 2012-06-20 NOTE — Progress Notes (Signed)
Subjective:     Patient ID: Brenda Lynn, female   DOB: [birth year], 52 y.o.   MRN: [id number]  HPI This patient follows up about 5  weeks status post open repair of left inguinal hernia with mesh. Her only complaints are some sensitivities and numbness in the area of her incision in the left groin but her pain is improving. She says that she is eating fine and her bowels are functioning normally as well. She says that she notices some hardness under the incision which seems to improve at night  Review of Systems     Objective:   Physical Exam NAD, nontoxic Incision is healing nicely and I do not appreciate any evidence of recurrent hernia with Valsalva she has a normal healing ridge but no evidence of recurrence     Assessment:     Status post open repair of left inguinal hernia with mesh-improving I think that she looks pretty good for this stage of her recovery and I do not see any evidence of recurrence or complication. I think at this point it would be okay for her to return to work and activity as tolerated. I explained that the numbness and sensitivity may or may not improve with time.    Plan:     Increase activity as tolerated and followup when necessary

## 2012-08-21 ENCOUNTER — Other Ambulatory Visit: Payer: Self-pay | Admitting: Physician Assistant

## 2012-08-21 ENCOUNTER — Other Ambulatory Visit (HOSPITAL_COMMUNITY)
Admission: RE | Admit: 2012-08-21 | Discharge: 2012-08-21 | Disposition: A | Discharge status: Home / Self Care | Payer: BC Managed Care – PPO | Source: Ambulatory Visit | Attending: Family Medicine | Admitting: Family Medicine

## 2012-08-21 DIAGNOSIS — Z01419 Encounter for gynecological examination (general) (routine) without abnormal findings: Secondary | ICD-10-CM | POA: Insufficient documentation

## 2012-12-11 ENCOUNTER — Other Ambulatory Visit: Payer: Self-pay

## 2015-04-29 ENCOUNTER — Encounter: Payer: Self-pay | Admitting: Surgery

## 2015-05-06 ENCOUNTER — Other Ambulatory Visit: Payer: Self-pay | Admitting: *Deleted

## 2015-05-06 DIAGNOSIS — I83891 Varicose veins of right lower extremities with other complications: Secondary | ICD-10-CM

## 2015-05-06 DIAGNOSIS — I83893 Varicose veins of bilateral lower extremities with other complications: Secondary | ICD-10-CM

## 2015-05-09 ENCOUNTER — Ambulatory Visit (HOSPITAL_COMMUNITY)
Admission: RE | Admit: 2015-05-09 | Discharge: 2015-05-09 | Disposition: A | Discharge status: Home / Self Care | Payer: BLUE CROSS/BLUE SHIELD | Source: Ambulatory Visit | Attending: Surgery | Admitting: Surgery

## 2015-05-09 ENCOUNTER — Ambulatory Visit (INDEPENDENT_AMBULATORY_CARE_PROVIDER_SITE_OTHER): Payer: BLUE CROSS/BLUE SHIELD | Admitting: Surgery

## 2015-05-09 ENCOUNTER — Encounter: Payer: Self-pay | Admitting: Surgery

## 2015-05-09 VITALS — BP 133/79 | HR 66 | Ht 71.0 in | Wt 160.3 lb

## 2015-05-09 DIAGNOSIS — I83893 Varicose veins of bilateral lower extremities with other complications: Secondary | ICD-10-CM | POA: Insufficient documentation

## 2015-05-09 DIAGNOSIS — I872 Venous insufficiency (chronic) (peripheral): Secondary | ICD-10-CM

## 2015-05-09 NOTE — Progress Notes (Signed)
[facility] [place]   Reason for referral: Swollen right > left leg  History of Present Illness  Brenda Lynn is a 55 y.o. female who presents with chief complaint: swollen leg.  Patient notes, onset of swelling > 10 years ago and getting progressively worse.  The swelling is  associated with her job at [employer] that keeps her standing for more than 8 hours per day.  .  The patient has had no history of DVT, positive history of varicose vein, no history of venous stasis ulcers, no history of  Lymphedema and positive history of skin changes in lower legs.  There is a family history of venous disorders.  The patient has  used compression stockings in the past.  She has had 3 biologic children.  Past medical history includes: Right knee degenerative joint disorder and seasonal allergies.  She denise history of DM, HTN, or hyperlipidemia.    Past Medical History  Diagnosis Date  . Hernia     Past Surgical History  Procedure Laterality Date  . Tympanostomy tube placement  as child  . Appendectomy  age 55  . Tubal ligation  yrs ago  . Neck plastic surgery      to remove birthmark, had keloids radiation tx done age 55  . Inguinal hernia repair Left 05/14/2012    Procedure: HERNIA REPAIR INGUINAL ADULT;  Surgeon: Lodema PilotBrian Layton, DO;  Location: [facility];  Service: General;  Laterality: Left;  . Insertion of mesh Left 05/14/2012    Procedure: INSERTION OF MESH;  Surgeon: Lodema PilotBrian Layton, DO;  Location: [facility];  Service: General;  Laterality: Left;    Social History   Social History  . Marital Status: Married    Spouse Name: N/A  . Number of Children: N/A  . Years of Education: N/A   Occupational History  . Not on file.   Social History Main Topics  . Smoking status: Never Smoker   . Smokeless tobacco: Never Used  . Alcohol Use: No  . Drug Use: No  . Sexual Activity: Not on file   Other Topics Concern  . Not on file   Social History Narrative    Family History   Problem Relation Age of Onset  . Cancer Father     leukemia  . Cancer Paternal Aunt     breast  . Cancer Maternal Grandfather     unknown to pt / ?skin    Current Outpatient Prescriptions on File Prior to Visit  Medication Sig Dispense Refill  . loratadine (CLARITIN) 10 MG tablet Take 10 mg by mouth every evening. Reported on 05/09/2015    . HYDROcodone-acetaminophen (NORCO) 5-325 MG per tablet Take 1 tablet by mouth every 6 (six) hours as needed for pain. (Patient not taking: Reported on 05/09/2015) 40 tablet 0   No current facility-administered medications on file prior to visit.    Allergies as of 05/09/2015 - Review Complete 05/09/2015  Allergen Reaction Noted  . Demerol [meperidine] Nausea Only 03/31/2012     ROS:   General:  No weight loss, Fever, chills  HEENT: No recent headaches, no nasal bleeding, no visual changes, no sore throat  Neurologic: No dizziness, blackouts, seizures. No recent symptoms of stroke or mini- stroke. No recent episodes of slurred speech, or temporary blindness.  Cardiac: No recent episodes of chest pain/pressure, no shortness of breath at rest.  No shortness of breath with exertion.  Denies history of atrial fibrillation or irregular heartbeat  Vascular: No history  of rest pain in feet.  No history of claudication.  No history of non-healing ulcer, No history of DVT   Pulmonary: No home oxygen, no productive cough, no hemoptysis,  No asthma or wheezing  Musculoskeletal:  [x ] Arthritis,  Low back pain,  [x ] Joint pain  Hematologic:No history of hypercoagulable state.  No history of easy bleeding.  No history of anemia  Gastrointestinal: No hematochezia or melena,  No gastroesophageal reflux, no trouble swallowing  Urinary:  chronic Kidney disease,  on HD -  MWF or  TTHS,  Burning with urination,  Frequent urination,  Difficulty urinating;   Skin: No rashes  Psychological: No history of anxiety,  No history of  depression  Physical Examination  Filed Vitals:   05/09/15 1544  BP: 133/79  Pulse: 66  Height:  (1.803 m)  Weight: 160 lb 4.8 oz (72.712 kg)  SpO2: 98%    Body mass index is 22.37 kg/(m^2).  General:  Alert and oriented, no acute distress HEENT: Normal Neck: No bruit or JVD Pulmonary: Clear to auscultation bilaterally Cardiac: Regular Rate and Rhythm without murmur Abdomen: Soft, non-tender, non-distended, no mass, no scars Skin: No rash Extremity Pulses:  2+ radial, brachial, femoral, dorsalis pedis  pulses bilaterally Musculoskeletal: positive right > left LE edema with spider veins and obvious medial right varicose veins Neurologic: Upper and lower extremity motor 5/5 and symmetric  DATA: Venous reflux study Right CFV, GSV with diameters from 0.71 to 1.17 positive reflux Left CFV, SFV, and GSV reflux with diameters from 0.31 to 0.60    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  She will f/u in 3-5 months for evaluation with Dr. Hart Rochester in the vein clinic to discuss possible laser ablation treatments.   We recommend elevation of bilateral LE when at rest.  Thomasena Edis, EMMA [facility] PA-C [facility] [place] Office: [phone] The patient was seen in conjunction with Dr. Myra Gianotti today   I agree with the above.  I have seen and evaluated the patient.  She has a long-standing history of problems with both lower extremities, right greater than left.  She reports that her biggest complaint is swelling and that her leg started to hurt and ache as the day progresses.  She has not worn compression stockings religiously although she will wear tight socks.  She has never had a bleeding episode.  She has never had a DVT.  She has never had an ulcer.  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  Mild skin color changes are noted on the medial ankle.  She does have pitting edema.  Venous reflux evaluation shows right common femoral reflux and reflux within the right great saphenous vein with maximum diameter of the vein 1.1 cm at the knee and 1.0 cm at the saphenofemoral junction.  The patient is going to be placed in 20-30 thigh-high compression stockings.  I have encouraged her to keep her legs elevated and to take ibuprofen for pain control.  I have her scheduled to follow up in late August, following her left knee replacement surgery for further discussions of treatment regarding her venous insufficiency.  Durene Cal

## 2015-05-13 ENCOUNTER — Encounter (HOSPITAL_COMMUNITY): Payer: Self-pay

## 2015-05-13 ENCOUNTER — Encounter: Payer: Self-pay | Admitting: Vascular Surgery

## 2015-07-18 NOTE — Patient Instructions (Addendum)
Brenda HerbKathryn Lynn  07/18/2015   Your procedure is scheduled on: 08-01-15  [facility]  Entrance [facility]  elevators to 3rd floor to  Short Stay Center at  0530 AM.  Call this number if you have problems the morning of surgery [phone]   Remember: ONLY 1 PERSON MAY GO WITH YOU TO SHORT STAY TO GET  READY MORNING OF YOUR SURGERY.  Do not eat food or drink liquids :After Midnight.     Take these medicines the morning of surgery with A SIP OF WATER: None. DO NOT TAKE ANY DIABETIC MEDICATIONS DAY OF YOUR SURGERY                               You may not have any metal on your body including hair pins and              piercings  Do not wear jewelry, make-up, lotions, powders or perfumes, deodorant             Do not wear nail polish.  Do not shave  48 hours prior to surgery.              Men may shave face and neck.   Do not bring valuables to the hospital. [facility] IS NOT             RESPONSIBLE   FOR VALUABLES.  Contacts, dentures or bridgework may not be worn into surgery.  Leave suitcase in the car. After surgery it may be brought to your room.     Patients discharged the day of surgery will not be allowed to drive home.  Name and phone number of your driver: Brenda RuizJohn -spouse [phone] cell  Special Instructions: N/A              Please read over the following fact sheets you were given: _____________________________________________________________________             [facility] - Preparing for Surgery Before surgery, you can play an important role.  Because skin is not sterile, your skin needs to be as free of germs as possible.  You can reduce the number of germs on your skin by washing with CHG (chlorahexidine gluconate) soap before surgery.  CHG is an antiseptic cleaner which kills germs and bonds with the skin to continue killing germs even after washing. Please DO NOT use if you have an allergy to CHG or antibacterial soaps.  If  your skin becomes reddened/irritated stop using the CHG and inform your nurse when you arrive at Short Stay. Do not shave (including legs and underarms) for at least 48 hours prior to the first CHG shower.  You may shave your face/neck. Please follow these instructions carefully:  1.  Shower with CHG Soap the night before surgery and the  morning of Surgery.  2.  If you choose to wash your hair, wash your hair first as usual with your  normal  shampoo.  3.  After you shampoo, rinse your hair and body thoroughly to remove the  shampoo.                           4.  Use CHG as you would any other liquid soap.  You can apply chg directly  to the skin  and wash                       Gently with a scrungie or clean washcloth.  5.  Apply the CHG Soap to your body ONLY FROM THE NECK DOWN.   Do not use on face/ open                           Wound or open sores. Avoid contact with eyes, ears mouth and genitals (private parts).                       Wash face,  Genitals (private parts) with your normal soap.             6.  Wash thoroughly, paying special attention to the area where your surgery  will be performed.  7.  Thoroughly rinse your body with warm water from the neck down.  8.  DO NOT shower/wash with your normal soap after using and rinsing off  the CHG Soap.                9.  Pat yourself dry with a clean towel.            10.  Wear clean pajamas.            11.  Place clean sheets on your bed the night of your first shower and do not  sleep with pets. Day of Surgery : Do not apply any lotions/deodorants the morning of surgery.  Please wear clean clothes to the hospital/surgery center.  FAILURE TO FOLLOW THESE INSTRUCTIONS MAY RESULT IN THE CANCELLATION OF YOUR SURGERY PATIENT SIGNATURE_________________________________  NURSE SIGNATURE__________________________________  ________________________________________________________________________   Brenda Lynn  An incentive  spirometer is a tool that can help keep your lungs clear and active. This tool measures how well you are filling your lungs with each breath. Taking long deep breaths may help reverse or decrease the chance of developing breathing (pulmonary) problems (especially infection) following:  A long period of time when you are unable to move or be active. BEFORE THE PROCEDURE   If the spirometer includes an indicator to show your best effort, your nurse or respiratory therapist will set it to a desired goal.  If possible, sit up straight or lean slightly forward. Try not to slouch.  Hold the incentive spirometer in an upright position. INSTRUCTIONS FOR USE   Sit on the edge of your bed if possible, or sit up as far as you can in bed or on a chair.  Hold the incentive spirometer in an upright position.  Breathe out normally.  Place the mouthpiece in your mouth and seal your lips tightly around it.  Breathe in slowly and as deeply as possible, raising the piston or the ball toward the top of the column.  Hold your breath for 3-5 seconds or for as long as possible. Allow the piston or ball to fall to the bottom of the column.  Remove the mouthpiece from your mouth and breathe out normally.  Rest for a few seconds and repeat Steps 1 through 7 at least 10 times every 1-2 hours when you are awake. Take your time and take a few normal breaths between deep breaths.  The spirometer may include an indicator to show your best effort. Use the indicator as a goal to work toward during each repetition.  After each set of 10  deep breaths, practice coughing to be sure your lungs are clear. If you have an incision (the cut made at the time of surgery), support your incision when coughing by placing a pillow or rolled up towels firmly against it. Once you are able to get out of bed, walk around indoors and cough well. You may stop using the incentive spirometer when instructed by your caregiver.  RISKS AND  COMPLICATIONS  Take your time so you do not get dizzy or light-headed.  If you are in pain, you may need to take or ask for pain medication before doing incentive spirometry. It is harder to take a deep breath if you are having pain. AFTER USE  Rest and breathe slowly and easily.  It can be helpful to keep track of a log of your progress. Your caregiver can provide you with a simple table to help with this. If you are using the spirometer at home, follow these instructions: [place] IF:   You are having difficultly using the spirometer.  You have trouble using the spirometer as often as instructed.  Your pain medication is not giving enough relief while using the spirometer.  You develop fever of 100.5 F (38.1 C) or higher. SEEK IMMEDIATE MEDICAL CARE IF:   You cough up bloody sputum that had not been present before.  You develop fever of 102 F (38.9 C) or greater.  You develop worsening pain at or near the incision site. MAKE SURE YOU:   Understand these instructions.  Will watch your condition.  Will get help right away if you are not doing well or get worse. Document Released: 06/04/2006 Document Revised: 04/16/2011 Document Reviewed: 08/05/2006 Exit[employer] Patient Information 2014 [employer], [place].   ________________________________________________________________________  WHAT IS A BLOOD TRANSFUSION? Blood Transfusion Information  A transfusion is the replacement of blood or some of its parts. Blood is made up of multiple cells which provide different functions.  Red blood cells carry oxygen and are used for blood loss replacement.  White blood cells fight against infection.  Platelets control bleeding.  Plasma helps clot blood.  Other blood products are available for specialized needs, such as hemophilia or other clotting disorders. BEFORE THE TRANSFUSION  Who gives blood for transfusions?   Healthy volunteers who are fully evaluated to make sure  their blood is safe. This is blood bank blood. Transfusion therapy is the safest it has ever been in the practice of medicine. Before blood is taken from a donor, a complete history is taken to make sure that person has no history of diseases nor engages in risky social behavior (examples are intravenous drug use or sexual activity with multiple partners). The donor's travel history is screened to minimize risk of transmitting infections, such as malaria. The donated blood is tested for signs of infectious diseases, such as HIV and hepatitis. The blood is then tested to be sure it is compatible with you in order to minimize the chance of a transfusion reaction. If you or a relative donates blood, this is often done in anticipation of surgery and is not appropriate for emergency situations. It takes many days to process the donated blood. RISKS AND COMPLICATIONS Although transfusion therapy is very safe and saves many lives, the main dangers of transfusion include:   Getting an infectious disease.  Developing a transfusion reaction. This is an allergic reaction to something in the blood you were given. Every precaution is taken to prevent this. The decision to have a blood transfusion  has been considered carefully by your caregiver before blood is given. Blood is not given unless the benefits outweigh the risks. AFTER THE TRANSFUSION  Right after receiving a blood transfusion, you will usually feel much better and more energetic. This is especially true if your red blood cells have gotten low (anemic). The transfusion raises the level of the red blood cells which carry oxygen, and this usually causes an energy increase.  The nurse administering the transfusion will monitor you carefully for complications. HOME CARE INSTRUCTIONS  No special instructions are needed after a transfusion. You may find your energy is better. Speak with your caregiver about any limitations on activity for underlying diseases  you may have. SEEK MEDICAL CARE IF:   Your condition is not improving after your transfusion.  You develop redness or irritation at the intravenous (IV) site. SEEK IMMEDIATE MEDICAL CARE IF:  Any of the following symptoms occur over the next 12 hours:  Shaking chills.  You have a temperature by mouth above 102 F (38.9 C), not controlled by medicine.  Chest, back, or muscle pain.  People around you feel you are not acting correctly or are confused.  Shortness of breath or difficulty breathing.  Dizziness and fainting.  You get a rash or develop hives.  You have a decrease in urine output.  Your urine turns a dark color or changes to pink, red, or brown. Any of the following symptoms occur over the next 10 days:  You have a temperature by mouth above 102 F (38.9 C), not controlled by medicine.  Shortness of breath.  Weakness after normal activity.  The white part of the eye turns yellow (jaundice).  You have a decrease in the amount of urine or are urinating less often.  Your urine turns a dark color or changes to pink, red, or brown. Document Released: 01/20/2000 Document Revised: 04/16/2011 Document Reviewed: 09/08/2007 [facility] Patient Information 2014 [place], [place].  _______________________________________________________________________

## 2015-07-20 ENCOUNTER — Encounter (HOSPITAL_COMMUNITY): Payer: Self-pay

## 2015-07-20 ENCOUNTER — Encounter (HOSPITAL_COMMUNITY)
Admission: RE | Admit: 2015-07-20 | Discharge: 2015-07-20 | Disposition: A | Discharge status: Home / Self Care | Payer: BLUE CROSS/BLUE SHIELD | Source: Ambulatory Visit | Attending: Orthopedic Surgery | Admitting: Orthopedic Surgery

## 2015-07-20 DIAGNOSIS — Z01812 Encounter for preprocedural laboratory examination: Secondary | ICD-10-CM | POA: Insufficient documentation

## 2015-07-20 DIAGNOSIS — M1712 Unilateral primary osteoarthritis, left knee: Secondary | ICD-10-CM | POA: Insufficient documentation

## 2015-07-20 DIAGNOSIS — Z0183 Encounter for blood typing: Secondary | ICD-10-CM | POA: Insufficient documentation

## 2015-07-20 HISTORY — DX: Unspecified hearing loss, left ear: H91.92

## 2015-07-20 HISTORY — DX: Allergy status to unspecified drugs, medicaments and biological substances: Z88.9

## 2015-07-20 HISTORY — DX: Unspecified osteoarthritis, unspecified site: M19.90

## 2015-07-20 HISTORY — DX: Asymptomatic varicose veins of unspecified lower extremity: I83.90

## 2015-07-20 LAB — TYPE AND SCREEN
ABO/RH(D): O POS
Antibody Screen: NEGATIVE

## 2015-07-20 LAB — SURGICAL PCR SCREEN
MRSA, PCR: NEGATIVE
STAPHYLOCOCCUS AUREUS: NEGATIVE

## 2015-07-20 LAB — ABO/RH: ABO/RH(D): O POS

## 2015-07-22 NOTE — H&P (Signed)
TOTAL KNEE ADMISSION H&P  Patient is being admitted for left total knee arthroplasty.  Subjective:  Chief Complaint:     Left knee primary OA / pain  HPI: Brenda Lynn, 55 y.o. female, has a history of pain and functional disability in the left knee due to arthritis and has failed non-surgical conservative treatments for greater than 12 weeks to include NSAID's and/or analgesics and activity modification.  Onset of symptoms was gradual, starting 1+ years ago with gradually worsening course since that time. The patient noted no past surgery on the left knee(s).  Patient currently rates pain in the left knee(s) at 9 out of 10 with activity. Patient has night pain, worsening of pain with activity and weight bearing, pain that interferes with activities of daily living, pain with passive range of motion, crepitus and joint swelling.  Patient has evidence of periarticular osteophytes and joint space narrowing by imaging studies. There is no active infection.   Risks, benefits and expectations were discussed with the patient.  Risks including but not limited to the risk of anesthesia, blood clots, nerve damage, blood vessel damage, failure of the prosthesis, infection and up to and including death.  Patient understand the risks, benefits and expectations and wishes to proceed with surgery.   PCP: Leanor RubensteinSUN,VYVYAN Y, MD  D/C Plans:      Home with HHPT  Post-op Meds:       No Rx given   Tranexamic Acid:      To be given - IV  Decadron:      Is to be given  FYI:     ASA  Norco      Past Medical History  Diagnosis Date  . Hernia     inguinal hernia at present-asymptomatic.  . Varicose veins     right leg greater than left- wears compression hose  . Hx of seasonal allergies   . Arthritis     osteoarthritis- hands, knees  . 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 Tympanostomy tube placement  as child  . Tubal ligation  yrs ago  . Neck  plastic surgery      to remove birthmark, had keloids radiation tx done age 55  . Inguinal hernia repair Left 05/14/2012    Procedure: HERNIA REPAIR INGUINAL ADULT;  Surgeon: Lodema PilotBrian Layton, DO;  Location: [facility];  Service: General;  Laterality: Left;  . Insertion of mesh Left 05/14/2012    Procedure: INSERTION OF MESH;  Surgeon: Lodema PilotBrian Layton, DO;  Location: [facility];  Service: General;  Laterality: Left;  . Appendectomy  age 72    open    No prescriptions prior to admission   Allergies  Allergen Reactions  . Demerol [Meperidine] Nausea And Vomiting    Social History  Substance Use Topics  . Smoking status: Never Smoker   . Smokeless tobacco: Never Used  . Alcohol Use: No     Comment: rare    Family History  Problem Relation Age of Onset  . Cancer Father     leukemia  . Cancer Paternal Aunt     breast  . Cancer Maternal Grandfather     unknown to pt / ?skin     Review of Systems  Constitutional: Negative.   HENT: Positive for hearing loss.   Eyes: Negative.   Respiratory: Negative.   Cardiovascular: Negative.   Gastrointestinal: Negative.   Genitourinary: Negative.   Musculoskeletal: Positive  for joint pain.  Skin: Negative.   Neurological: Negative.   Endo/Heme/Allergies: Positive for environmental allergies.  Psychiatric/Behavioral: Negative.     Objective:  Physical Exam  Constitutional: She is oriented to person, place, and time. She appears well-developed.  HENT:  Head: Normocephalic.  Eyes: Pupils are equal, round, and reactive to light.  Neck: Neck supple. No JVD present. No tracheal deviation present. No thyromegaly present.  Cardiovascular: Normal rate, regular rhythm, normal heart sounds and intact distal pulses.   Respiratory: Effort normal and breath sounds normal. No stridor. No respiratory distress. She has no wheezes.  GI: Soft. There is no tenderness. There is no guarding.  Musculoskeletal:       Left knee: She exhibits decreased range of motion,  swelling and bony tenderness. She exhibits no ecchymosis, no deformity, no laceration and no erythema. Tenderness found.  Lymphadenopathy:    She has no cervical adenopathy.  Neurological: She is alert and oriented to person, place, and time. A sensory deficit (tingling bilateral LEs  R>L) is present.  Skin: Skin is warm and dry.  Psychiatric: She has a normal mood and affect.      Labs:  Estimated body mass index is 23.30 kg/(m^2) as calculated from the following:   Height as of 06/20/12:  (1.803 m).   Weight as of 06/20/12: 75.751 kg (167 lb).   Imaging Review Plain radiographs demonstrate severe degenerative joint disease of the left knee(s).  The bone quality appears to be good for age and reported activity level.  Assessment/Plan:  End stage arthritis, left knee   The patient history, physical examination, clinical judgment of the provider and imaging studies are consistent with end stage degenerative joint disease of the left knee(s) and total knee arthroplasty is deemed medically necessary. The treatment options including medical management, injection therapy arthroscopy and arthroplasty were discussed at length. The risks and benefits of total knee arthroplasty were presented and reviewed. The risks due to aseptic loosening, infection, stiffness, patella tracking problems, thromboembolic complications and other imponderables were discussed. The patient acknowledged the explanation, agreed to proceed with the plan and consent was signed. Patient is being admitted for inpatient treatment for surgery, pain control, PT, OT, prophylactic antibiotics, VTE prophylaxis, progressive ambulation and ADL's and discharge planning. The patient is planning to be discharged home with home health services.       Anastasio Auerbach Nyron Mozer   PA-C  07/22/2015, 3:29 PM

## 2015-07-31 NOTE — Anesthesia Preprocedure Evaluation (Addendum)
Anesthesia Evaluation  Patient identified by MRN, date of birth, ID band Patient awake    Reviewed: Allergy & Precautions, NPO status , Patient's Chart, lab work & pertinent test results  History of Anesthesia Complications Negative for: history of anesthetic complications  Airway Mallampati: II  TM Distance: >3 FB Neck ROM: Full    Dental no notable dental hx. (+) Dental Advisory Given   Pulmonary neg pulmonary ROS,    Pulmonary exam normal        Cardiovascular negative cardio ROS Normal cardiovascular exam     Neuro/Psych negative neurological ROS  negative psych ROS   GI/Hepatic negative GI ROS, Neg liver ROS,   Endo/Other  negative endocrine ROS  Renal/GU negative Renal ROS  negative genitourinary   Musculoskeletal  (+) Arthritis ,   Abdominal   Peds  Hematology   Anesthesia Other Findings   Reproductive/Obstetrics                            Anesthesia Physical Anesthesia Plan  ASA: II  Anesthesia Plan: MAC and Spinal   Post-op Pain Management:    Induction:   Airway Management Planned: Simple Face Mask and Natural Airway  Additional Equipment:   Intra-op Plan:   Post-operative Plan:   Informed Consent: I have reviewed the patients History and Physical, chart, labs and discussed the procedure including the risks, benefits and alternatives for the proposed anesthesia with the patient or authorized representative who has indicated his/her understanding and acceptance.   Dental advisory given  Plan Discussed with: CRNA and Anesthesiologist  Anesthesia Plan Comments:        Anesthesia Quick Evaluation

## 2015-08-01 ENCOUNTER — Encounter (HOSPITAL_COMMUNITY): Payer: Self-pay | Admitting: *Deleted

## 2015-08-01 ENCOUNTER — Inpatient Hospital Stay (HOSPITAL_COMMUNITY): Payer: BLUE CROSS/BLUE SHIELD | Admitting: Anesthesiology

## 2015-08-01 ENCOUNTER — Inpatient Hospital Stay (HOSPITAL_COMMUNITY)
Admission: RE | Admit: 2015-08-01 | Discharge: 2015-08-02 | DRG: 470 | Disposition: A | Discharge status: Home Health | Payer: BLUE CROSS/BLUE SHIELD | Source: Ambulatory Visit | Attending: Orthopedic Surgery | Admitting: Orthopedic Surgery

## 2015-08-01 ENCOUNTER — Encounter (HOSPITAL_COMMUNITY)
Admission: RE | Disposition: A | Discharge status: Home Health | Payer: Self-pay | Source: Ambulatory Visit | Attending: Orthopedic Surgery

## 2015-08-01 DIAGNOSIS — Z96659 Presence of unspecified artificial knee joint: Secondary | ICD-10-CM

## 2015-08-01 DIAGNOSIS — Z96652 Presence of left artificial knee joint: Secondary | ICD-10-CM

## 2015-08-01 DIAGNOSIS — M659 Synovitis and tenosynovitis, unspecified: Secondary | ICD-10-CM | POA: Diagnosis present

## 2015-08-01 DIAGNOSIS — H9192 Unspecified hearing loss, left ear: Secondary | ICD-10-CM | POA: Diagnosis present

## 2015-08-01 DIAGNOSIS — M25562 Pain in left knee: Secondary | ICD-10-CM | POA: Diagnosis present

## 2015-08-01 DIAGNOSIS — M1712 Unilateral primary osteoarthritis, left knee: Principal | ICD-10-CM | POA: Diagnosis present

## 2015-08-01 HISTORY — PX: TOTAL KNEE ARTHROPLASTY: SHX125

## 2015-08-01 SURGERY — ARTHROPLASTY, KNEE, TOTAL
Anesthesia: Monitor Anesthesia Care | Site: Knee | Laterality: Left

## 2015-08-01 MED ORDER — MIDAZOLAM HCL 5 MG/5ML IJ SOLN
INTRAMUSCULAR | Status: DC | PRN
  Administered 2015-08-01: 2 mg via INTRAVENOUS

## 2015-08-01 MED ORDER — FENTANYL CITRATE (PF) 100 MCG/2ML IJ SOLN
INTRAMUSCULAR | Status: AC
  Filled 2015-08-01: qty 2

## 2015-08-01 MED ORDER — LACTATED RINGERS IV SOLN
INTRAVENOUS | Status: DC | PRN
Start: 2015-08-01 — End: 2015-08-01
  Administered 2015-08-01 (×2): via INTRAVENOUS

## 2015-08-01 MED ORDER — SODIUM CHLORIDE 0.9 % IR SOLN
Status: DC | PRN
  Administered 2015-08-01: 1000 mL

## 2015-08-01 MED ORDER — ONDANSETRON HCL 4 MG/2ML IJ SOLN
4.0000 mg | Freq: Four times a day (QID) | INTRAMUSCULAR | Status: DC | PRN
  Administered 2015-08-01: 4 mg via INTRAVENOUS
  Filled 2015-08-01: qty 2

## 2015-08-01 MED ORDER — ONDANSETRON HCL 4 MG PO TABS: 4.0000 mg | ORAL_TABLET | Freq: Four times a day (QID) | ORAL | Status: DC | PRN

## 2015-08-01 MED ORDER — CEFAZOLIN SODIUM-DEXTROSE 2-4 GM/100ML-% IV SOLN
2.0000 g | INTRAVENOUS | Status: AC
  Administered 2015-08-01: 2 g via INTRAVENOUS
  Filled 2015-08-01: qty 100

## 2015-08-01 MED ORDER — PROMETHAZINE HCL 25 MG/ML IJ SOLN: 6.2500 mg | INTRAMUSCULAR | Status: DC | PRN

## 2015-08-01 MED ORDER — TRANEXAMIC ACID 1000 MG/10ML IV SOLN
1000.0000 mg | Freq: Once | INTRAVENOUS | Status: AC
  Administered 2015-08-01: 1000 mg via INTRAVENOUS
  Filled 2015-08-01: qty 10

## 2015-08-01 MED ORDER — 0.9 % SODIUM CHLORIDE (POUR BTL) OPTIME
TOPICAL | Status: DC | PRN
  Administered 2015-08-01: 1000 mL

## 2015-08-01 MED ORDER — FENTANYL CITRATE (PF) 100 MCG/2ML IJ SOLN
INTRAMUSCULAR | Status: DC | PRN
  Administered 2015-08-01 (×2): 50 ug via INTRAVENOUS
  Administered 2015-08-01: 100 ug via INTRAVENOUS

## 2015-08-01 MED ORDER — ALUM & MAG HYDROXIDE-SIMETH 200-200-20 MG/5ML PO SUSP: 30.0000 mL | ORAL | Status: DC | PRN

## 2015-08-01 MED ORDER — BUPIVACAINE HCL (PF) 0.75 % IJ SOLN
INTRAMUSCULAR | Status: DC | PRN
  Administered 2015-08-01: 2 mL via INTRATHECAL

## 2015-08-01 MED ORDER — EPHEDRINE SULFATE 50 MG/ML IJ SOLN
INTRAMUSCULAR | Status: DC | PRN
  Administered 2015-08-01: 10 mg via INTRAVENOUS
  Administered 2015-08-01: 5 mg via INTRAVENOUS

## 2015-08-01 MED ORDER — DIPHENHYDRAMINE HCL 25 MG PO CAPS: 25.0000 mg | ORAL_CAPSULE | Freq: Four times a day (QID) | ORAL | Status: DC | PRN

## 2015-08-01 MED ORDER — METHOCARBAMOL 500 MG PO TABS: 500.0000 mg | ORAL_TABLET | Freq: Four times a day (QID) | ORAL | Status: DC | PRN

## 2015-08-01 MED ORDER — METHOCARBAMOL 500 MG PO TABS
500.0000 mg | ORAL_TABLET | Freq: Four times a day (QID) | ORAL | Status: DC | PRN
  Administered 2015-08-01: 500 mg via ORAL
  Filled 2015-08-01: qty 1

## 2015-08-01 MED ORDER — METOCLOPRAMIDE HCL 5 MG PO TABS: 5.0000 mg | ORAL_TABLET | Freq: Three times a day (TID) | ORAL | Status: DC | PRN

## 2015-08-01 MED ORDER — BUPIVACAINE-EPINEPHRINE (PF) 0.25% -1:200000 IJ SOLN
INTRAMUSCULAR | Status: DC | PRN
  Administered 2015-08-01: 30 mL

## 2015-08-01 MED ORDER — MAGNESIUM CITRATE PO SOLN: 1.0000 | Freq: Once | ORAL | Status: DC | PRN

## 2015-08-01 MED ORDER — MIDAZOLAM HCL 2 MG/2ML IJ SOLN
INTRAMUSCULAR | Status: AC
  Filled 2015-08-01: qty 2

## 2015-08-01 MED ORDER — SODIUM CHLORIDE 0.9 % IJ SOLN
INTRAMUSCULAR | Status: AC
  Filled 2015-08-01: qty 50

## 2015-08-01 MED ORDER — DEXTROSE 5 % IV SOLN
500.0000 mg | Freq: Four times a day (QID) | INTRAVENOUS | Status: DC | PRN
  Administered 2015-08-01: 500 mg via INTRAVENOUS
  Filled 2015-08-01: qty 550
  Filled 2015-08-01: qty 5

## 2015-08-01 MED ORDER — DOCUSATE SODIUM 100 MG PO CAPS
100.0000 mg | ORAL_CAPSULE | Freq: Two times a day (BID) | ORAL | Status: DC
  Administered 2015-08-01 – 2015-08-02 (×3): 100 mg via ORAL
  Filled 2015-08-01 (×3): qty 1

## 2015-08-01 MED ORDER — DEXAMETHASONE SODIUM PHOSPHATE 10 MG/ML IJ SOLN
10.0000 mg | Freq: Once | INTRAMUSCULAR | Status: AC
  Administered 2015-08-02: 10 mg via INTRAVENOUS
  Filled 2015-08-01: qty 1

## 2015-08-01 MED ORDER — CEFAZOLIN SODIUM-DEXTROSE 2-4 GM/100ML-% IV SOLN
2.0000 g | Freq: Four times a day (QID) | INTRAVENOUS | Status: AC
  Administered 2015-08-01 (×2): 2 g via INTRAVENOUS
  Filled 2015-08-01 (×2): qty 100

## 2015-08-01 MED ORDER — KETOROLAC TROMETHAMINE 30 MG/ML IJ SOLN
INTRAMUSCULAR | Status: DC | PRN
  Administered 2015-08-01: 30 mg

## 2015-08-01 MED ORDER — PROPOFOL 10 MG/ML IV BOLUS
INTRAVENOUS | Status: AC
  Filled 2015-08-01: qty 40

## 2015-08-01 MED ORDER — CELECOXIB 200 MG PO CAPS: 200.0000 mg | ORAL_CAPSULE | Freq: Two times a day (BID) | ORAL | Status: DC

## 2015-08-01 MED ORDER — HYDROCODONE-ACETAMINOPHEN 7.5-325 MG PO TABS: 1.0000 | ORAL_TABLET | ORAL | Status: DC | PRN

## 2015-08-01 MED ORDER — HYDROMORPHONE HCL 1 MG/ML IJ SOLN: 0.5000 mg | INTRAMUSCULAR | Status: DC | PRN

## 2015-08-01 MED ORDER — ASPIRIN EC 325 MG PO TBEC
325.0000 mg | DELAYED_RELEASE_TABLET | Freq: Two times a day (BID) | ORAL | Status: DC
  Administered 2015-08-02: 325 mg via ORAL
  Filled 2015-08-01: qty 1

## 2015-08-01 MED ORDER — PROPOFOL 500 MG/50ML IV EMUL
INTRAVENOUS | Status: DC | PRN
  Administered 2015-08-01: 50 ug/kg/min via INTRAVENOUS

## 2015-08-01 MED ORDER — CELECOXIB 200 MG PO CAPS
200.0000 mg | ORAL_CAPSULE | Freq: Two times a day (BID) | ORAL | Status: DC
  Administered 2015-08-01 – 2015-08-02 (×3): 200 mg via ORAL
  Filled 2015-08-01 (×3): qty 1

## 2015-08-01 MED ORDER — CHLORHEXIDINE GLUCONATE 4 % EX LIQD: 60.0000 mL | Freq: Once | CUTANEOUS | Status: DC

## 2015-08-01 MED ORDER — LORATADINE 10 MG PO TABS: 10.0000 mg | ORAL_TABLET | Freq: Every day | ORAL | Status: DC | PRN

## 2015-08-01 MED ORDER — FERROUS SULFATE 325 (65 FE) MG PO TABS: 325.0000 mg | ORAL_TABLET | Freq: Three times a day (TID) | ORAL | Status: DC

## 2015-08-01 MED ORDER — SODIUM CHLORIDE 0.9 % IV SOLN
INTRAVENOUS | Status: DC
  Administered 2015-08-01: 15:00:00 via INTRAVENOUS
  Filled 2015-08-01 (×4): qty 1000

## 2015-08-01 MED ORDER — DEXAMETHASONE SODIUM PHOSPHATE 10 MG/ML IJ SOLN
10.0000 mg | Freq: Once | INTRAMUSCULAR | Status: AC
Start: 2015-08-01 — End: 2015-08-01
  Administered 2015-08-01: 10 mg via INTRAVENOUS

## 2015-08-01 MED ORDER — ASPIRIN 325 MG PO TBEC: 325.0000 mg | DELAYED_RELEASE_TABLET | Freq: Two times a day (BID) | ORAL | Status: AC

## 2015-08-01 MED ORDER — CEFAZOLIN SODIUM-DEXTROSE 2-4 GM/100ML-% IV SOLN
INTRAVENOUS | Status: AC
  Filled 2015-08-01: qty 100

## 2015-08-01 MED ORDER — BUPIVACAINE-EPINEPHRINE 0.25% -1:200000 IJ SOLN
INTRAMUSCULAR | Status: AC
  Filled 2015-08-01: qty 1

## 2015-08-01 MED ORDER — POLYETHYLENE GLYCOL 3350 17 G PO PACK
17.0000 g | PACK | Freq: Two times a day (BID) | ORAL | Status: DC
  Administered 2015-08-01 – 2015-08-02 (×2): 17 g via ORAL
  Filled 2015-08-01 (×2): qty 1

## 2015-08-01 MED ORDER — DOCUSATE SODIUM 100 MG PO CAPS: 100.0000 mg | ORAL_CAPSULE | Freq: Two times a day (BID) | ORAL | Status: DC

## 2015-08-01 MED ORDER — KETOROLAC TROMETHAMINE 30 MG/ML IJ SOLN
INTRAMUSCULAR | Status: AC
  Filled 2015-08-01: qty 1

## 2015-08-01 MED ORDER — STERILE WATER FOR IRRIGATION IR SOLN
Status: DC | PRN
  Administered 2015-08-01: 2000 mL

## 2015-08-01 MED ORDER — HYDROCODONE-ACETAMINOPHEN 7.5-325 MG PO TABS
1.0000 | ORAL_TABLET | ORAL | Status: DC
  Administered 2015-08-01 – 2015-08-02 (×7): 2 via ORAL
  Filled 2015-08-01 (×7): qty 2

## 2015-08-01 MED ORDER — HYDROMORPHONE HCL 1 MG/ML IJ SOLN: 0.2500 mg | INTRAMUSCULAR | Status: DC | PRN

## 2015-08-01 MED ORDER — MENTHOL 3 MG MT LOZG: 1.0000 | LOZENGE | OROMUCOSAL | Status: DC | PRN

## 2015-08-01 MED ORDER — FERROUS SULFATE 325 (65 FE) MG PO TABS
325.0000 mg | ORAL_TABLET | Freq: Three times a day (TID) | ORAL | Status: DC
  Administered 2015-08-02: 325 mg via ORAL
  Filled 2015-08-01 (×3): qty 1

## 2015-08-01 MED ORDER — PHENOL 1.4 % MT LIQD
1.0000 | OROMUCOSAL | Status: DC | PRN
Start: 2015-08-01 — End: 2015-08-02
  Filled 2015-08-01: qty 177

## 2015-08-01 MED ORDER — BISACODYL 10 MG RE SUPP: 10.0000 mg | Freq: Every day | RECTAL | Status: DC | PRN

## 2015-08-01 MED ORDER — HYDROMORPHONE HCL 1 MG/ML IJ SOLN
INTRAMUSCULAR | Status: AC
  Filled 2015-08-01: qty 1

## 2015-08-01 MED ORDER — SODIUM CHLORIDE 0.9 % IJ SOLN
INTRAMUSCULAR | Status: DC | PRN
  Administered 2015-08-01: 30 mL

## 2015-08-01 MED ORDER — POLYETHYLENE GLYCOL 3350 17 G PO PACK: 17.0000 g | PACK | Freq: Two times a day (BID) | ORAL | Status: DC

## 2015-08-01 MED ORDER — METOCLOPRAMIDE HCL 5 MG/ML IJ SOLN
5.0000 mg | Freq: Three times a day (TID) | INTRAMUSCULAR | Status: DC | PRN
  Administered 2015-08-01: 10 mg via INTRAVENOUS
  Filled 2015-08-01: qty 2

## 2015-08-01 SURGICAL SUPPLY — 46 items
BAG ZIPLOCK 12X15 (MISCELLANEOUS) ×3 IMPLANT
BANDAGE ACE 6X5 VEL STRL LF (GAUZE/BANDAGES/DRESSINGS) ×3 IMPLANT
BLADE SAW SGTL 13.0X1.19X90.0M (BLADE) ×3 IMPLANT
BOWL SMART MIX CTS (DISPOSABLE) ×3 IMPLANT
CAPT KNEE TOTAL 3 ATTUNE ×3 IMPLANT
CEMENT HV SMART SET (Cement) ×6 IMPLANT
CLOTH BEACON ORANGE TIMEOUT ST (SAFETY) ×3 IMPLANT
CUFF TOURN SGL QUICK 34 (TOURNIQUET CUFF) ×2
CUFF TRNQT CYL 34X4X40X1 (TOURNIQUET CUFF) ×1 IMPLANT
DECANTER SPIKE VIAL GLASS SM (MISCELLANEOUS) ×3 IMPLANT
DRAPE U-SHAPE 47X51 STRL (DRAPES) ×3 IMPLANT
DRESSING AQUACEL AG SP 3.5X10 (GAUZE/BANDAGES/DRESSINGS) ×1 IMPLANT
DRSG AQUACEL AG SP 3.5X10 (GAUZE/BANDAGES/DRESSINGS) ×3
DURAPREP 26ML APPLICATOR (WOUND CARE) ×6 IMPLANT
ELECT REM PT RETURN 9FT ADLT (ELECTROSURGICAL) ×3
ELECTRODE REM PT RTRN 9FT ADLT (ELECTROSURGICAL) ×1 IMPLANT
GLOVE BIO SURGEON STRL SZ 6.5 (GLOVE) ×2 IMPLANT
GLOVE BIO SURGEONS STRL SZ 6.5 (GLOVE) ×1
GLOVE BIOGEL PI IND STRL 7.0 (GLOVE) ×1 IMPLANT
GLOVE BIOGEL PI IND STRL 7.5 (GLOVE) ×4 IMPLANT
GLOVE BIOGEL PI IND STRL 8.5 (GLOVE) ×1 IMPLANT
GLOVE BIOGEL PI INDICATOR 7.0 (GLOVE) ×2
GLOVE BIOGEL PI INDICATOR 7.5 (GLOVE) ×8
GLOVE BIOGEL PI INDICATOR 8.5 (GLOVE) ×2
GLOVE ECLIPSE 8.0 STRL XLNG CF (GLOVE) ×9 IMPLANT
GLOVE ORTHO TXT STRL SZ7.5 (GLOVE) ×6 IMPLANT
GLOVE SURG SS PI 6.5 STRL IVOR (GLOVE) ×3 IMPLANT
GLOVE SURG SS PI 7.0 STRL IVOR (GLOVE) ×3 IMPLANT
GOWN STRL REUS W/TWL LRG LVL3 (GOWN DISPOSABLE) ×3 IMPLANT
GOWN STRL REUS W/TWL XL LVL3 (GOWN DISPOSABLE) ×9 IMPLANT
HANDPIECE INTERPULSE COAX TIP (DISPOSABLE) ×2
LIQUID BAND (GAUZE/BANDAGES/DRESSINGS) ×3 IMPLANT
MANIFOLD NEPTUNE II (INSTRUMENTS) ×3 IMPLANT
PACK TOTAL KNEE CUSTOM (KITS) ×3 IMPLANT
POSITIONER SURGICAL ARM (MISCELLANEOUS) ×3 IMPLANT
SET HNDPC FAN SPRY TIP SCT (DISPOSABLE) ×1 IMPLANT
SET PAD KNEE POSITIONER (MISCELLANEOUS) ×3 IMPLANT
SUT MNCRL AB 4-0 PS2 18 (SUTURE) ×3 IMPLANT
SUT VIC AB 1 CT1 36 (SUTURE) ×3 IMPLANT
SUT VIC AB 2-0 CT1 27 (SUTURE) ×6
SUT VIC AB 2-0 CT1 TAPERPNT 27 (SUTURE) ×3 IMPLANT
SUT VLOC 180 0 24IN GS25 (SUTURE) ×3 IMPLANT
SYR 50ML LL SCALE MARK (SYRINGE) ×3 IMPLANT
TRAY FOLEY W/METER SILVER 14FR (SET/KITS/TRAYS/PACK) ×3 IMPLANT
WRAP KNEE MAXI GEL POST OP (GAUZE/BANDAGES/DRESSINGS) ×3 IMPLANT
YANKAUER SUCT BULB TIP 10FT TU (MISCELLANEOUS) ×3 IMPLANT

## 2015-08-01 NOTE — Discharge Instructions (Signed)

## 2015-08-01 NOTE — Op Note (Signed)
NAME:  Brenda Lynn                      MEDICAL RECORD NO.:  [id number]                             FACILITY[facility]  [facility]      PHYSICIAN:  Madlyn FrankelMatthew D. Charlann Boxerlin, M.D.  DATE OF BIRTH:  [birth year]      DATE OF PROCEDURE:  08/01/2015                                     OPERATIVE REPORT         PREOPERATIVE DIAGNOSIS:  Left knee osteoarthritis.      POSTOPERATIVE DIAGNOSIS:  Left knee osteoarthritis.      FINDINGS:  The patient was noted to have complete loss of cartilage and   bone-on-bone arthritis with associated osteophytes in the lateral and patellofemoral compartments of   the knee with a significant synovitis and associated effusion.      PROCEDURE:  Left total knee replacement.      COMPONENTS USED:  DePuy Attune rotating platform posterior stabilized knee   system, a size 5N femur, 5 tibia, size 7 mm PS AOX insert, and 38 anatomic patellar   button.      SURGEON:  Madlyn FrankelMatthew D. Charlann Boxerlin, M.D.      ASSISTANT:  Lanney GinsMatthew Babish, PA-C.      ANESTHESIA:  Spinal.      SPECIMENS:  None.      COMPLICATION:  None.      DRAINS:  One Hemovac.  EBL: <150cc      TOURNIQUET TIME:   Total Tourniquet Time Documented: Thigh (Left) - 34 minutes Total: Thigh (Left) - 34 minutes  .      The patient was stable to the recovery room.      INDICATION FOR PROCEDURE:  Brenda HerbKathryn Rao is a 55 y.o. female patient of   mine.  The patient had been seen, evaluated, and treated conservatively in the   office with medication, activity modification, and injections.  The patient had   radiographic changes of bone-on-bone arthritis with endplate sclerosis and osteophytes noted.      The patient failed conservative measures including medication, injections, and activity modification, and at this point was ready for more definitive measures.   Based on the radiographic changes and failed conservative measures, the patient   decided to proceed with total knee replacement.  Risks of infection,   DVT,  component failure, need for revision surgery, postop course, and   expectations were all   discussed and reviewed.  Consent was obtained for benefit of pain   relief.      PROCEDURE IN DETAIL:  The patient was brought to the operative theater.   Once adequate anesthesia, preoperative antibiotics, 2 gm of Ancef, 1 gm of Tranexamic Acid, and 10 mg of Decadron administered, the patient was positioned supine with the left thigh tourniquet placed.  The  left lower extremity was prepped and draped in sterile fashion.  A time-   out was performed identifying the patient, planned procedure, and   extremity.      The left lower extremity [facility].  The leg was   exsanguinated, tourniquet elevated to 250 mmHg.  A midline incision was   made followed  by median parapatellar arthrotomy.  Following initial   exposure, attention was first directed to the patella.  Precut   measurement was noted to be 26 mm.  I resected down to 14 mm and used a   38 anatomic patellar button to restore patellar height as well as cover the cut   surface.      The lug holes were drilled and a metal shim was placed to protect the   patella from retractors and saw blades.      At this point, attention was now directed to the femur.  The femoral   canal was opened with a drill, irrigated to try to prevent fat emboli.  An   intramedullary rod was passed at 3 degrees valgus, 9 mm of bone was   resected off the distal femur.  Following this resection, the tibia was   subluxated anteriorly.  Using the extramedullary guide, 2 mm of bone was resected off   the proximal lateral tibia.  We confirmed the gap would be   stable medially and laterally with a size 5 mm insert as well as confirmed   the cut was perpendicular in the coronal plane, checking with an alignment rod.      Once this was done, I sized the femur to be a size 5 in the anterior-   posterior dimension, chose a narrow component based on  medial and   lateral dimension.  The size 5 rotation block was then pinned in   position anterior referenced using the C-clamp to set rotation.  The   anterior, posterior, and  chamfer cuts were made without difficulty nor   notching making certain that I was along the anterior cortex to help   with flexion gap stability.      The final box cut was made off the lateral aspect of distal femur.      At this point, the tibia was sized to be a size 5, the size 5 tray was   then pinned in position through the medial third of the tubercle,   drilled, and keel punched.  Trial reduction was now carried with a 5 femur,  5 tibia, a size 6 then 7 mm insert, and the 38 anatomic patella botton.  During this initial trial period I recognized an imbalance between extension and flexion.  Due to this iI removed the trial femoral component and reusing the distal and 4 in 1 cutting blocks re-cut the distal femur by 2 additional millimeters.  The femoral component was replaced and now I re-trialed with the 7 mm insert.  The knee was brought to full  extension, full extension with good flexion stability with the patella   tracking through the trochlea without application of pressure.  Given   all these findings the femoral lugs were drilled as was sclerotic lateral femoral bone and then the trial components removed.  Final components were   opened and cement was mixed.  The knee was irrigated with normal saline   solution and pulse lavage.  The synovial lining was   then injected with 30 cc of 0.25% Marcaine with epinephrine and 1 cc of Toradol plus 30 cc of NS for a total of 61 cc.      The knee was irrigated.  Final implants were then cemented onto clean and   dried cut surfaces of bone with the knee brought to extension with a size 7 mm trial insert.      Once the cement  had fully cured, the excess cement was removed   throughout the knee.  I confirmed I was satisfied with the range of   motion and stability,  and the final size 7 mm PS AOX insert was chosen.  It was   placed into the knee.      The tourniquet had been let down at 34 minutes.  No significant   hemostasis required.  The   extensor mechanism was then reapproximated using #1 Vicryl and #0 V-lock sutures with the knee   in flexion.  The   remaining wound was closed with 2-0 Vicryl and running 4-0 Monocryl.   The knee was cleaned, dried, dressed sterilely using Dermabond and   Aquacel dressing.  The patient was then   brought to recovery room in stable condition, tolerating the procedure   well.   Please note that Physician Assistant, Lanney Gins, PA-C, was present for the entirety of the case, and was utilized for pre-operative positioning, peri-operative retractor management, general facilitation of the procedure.  He was also utilized for primary wound closure at the end of the case.              Madlyn Frankel Charlann Boxer, M.D.    08/01/2015 8:56 AM

## 2015-08-01 NOTE — Evaluation (Signed)
Physical Therapy Evaluation Patient Details Name: Brenda HerbKathryn Lynn MRN: [id number] DOB: 11/10/1960 Today's Date: 08/01/2015   History of Present Illness  s/p L TKA  Clinical Impression  Pt is s/p TKA resulting in the deficits listed below (see PT Problem List).  Pt will benefit from skilled PT to increase their independence and safety with mobility to allow discharge to the venue listed below. Should progress well, will follow     Follow Up Recommendations Home health PT    Equipment Recommendations   (? has standard walker )    Recommendations for Other Services       Precautions / Restrictions Precautions Precautions: Knee Restrictions Weight Bearing Restrictions: No Other Position/Activity Restrictions: WBAT      Mobility  Bed Mobility Overal bed mobility: Needs Assistance Bed Mobility: Supine to Sit     Supine to sit: Min assist     General bed mobility comments: assist with LLE  Transfers Overall transfer level: Needs assistance Equipment used: Rolling walker (2 wheeled) Transfers: Sit to/from Stand Sit to Stand: Min guard         General transfer comment: cues for hand placement  Ambulation/Gait Ambulation/Gait assistance: Min assist;Min guard Ambulation Distance (Feet): 36 Feet Assistive device: Rolling walker (2 wheeled) Gait Pattern/deviations: Step-to pattern;Antalgic;Decreased weight shift to left     General Gait Details: cues for posture, sequence, RW position  Stairs            Wheelchair Mobility    Modified Rankin (Stroke Patients Only)       Balance Overall balance assessment: Needs assistance           Standing balance-Leahy Scale: Fair                               Pertinent Vitals/Pain Pain Assessment: 0-10 Pain Score: 1  Pain Location: L knee Pain Intervention(s): Limited activity within patient's tolerance;Monitored during session;Premedicated before session;Repositioned    Home Living  Family/patient expects to be discharged to:: Private residence Living Arrangements: Spouse/significant other   Type of Home: House Home Access: Level entry     Home Layout: Multi-level Home Equipment: Cane - single point;Walker - standard      Prior Function Level of Independence: Independent               Hand Dominance        Extremity/Trunk Assessment               Lower Extremity Assessment: LLE deficits/detail   LLE Deficits / Details: hip flexion and knee extension 2/5, limited by pain; ankle WFL     Communication   Communication: No difficulties  Cognition Arousal/Alertness: Awake/alert Behavior During Therapy: WFL for tasks assessed/performed Overall Cognitive Status: Within Functional Limits for tasks assessed                      General Comments      Exercises Total Joint Exercises Ankle Circles/Pumps: AROM;Both;5 reps Quad Sets: AROM;Both;5 reps      Assessment/Plan    PT Assessment Patient needs continued PT services  PT Diagnosis Difficulty walking   PT Problem List Decreased strength;Decreased range of motion;Decreased activity tolerance;Decreased mobility;Decreased knowledge of use of DME;Pain  PT Treatment Interventions DME instruction;Gait training;Stair training;Functional mobility training;Therapeutic activities;Therapeutic exercise;Patient/family education   PT Goals (Current goals can be found in the Care Plan section) Acute Rehab PT Goals Patient Stated Goal: home PT Goal  Formulation: With patient Time For Goal Achievement: 08/05/15 Potential to Achieve Goals: Good    Frequency 7X/week   Barriers to discharge        Co-evaluation               End of Session Equipment Utilized During Treatment: Gait belt Activity Tolerance: Patient tolerated treatment well Patient left: with call bell/phone within reach;in chair;with nursing/sitter in room Nurse Communication: Mobility status         Time:  1400-1430 PT Time Calculation (min) (ACUTE ONLY): 30 min   Charges:   PT Evaluation $PT Eval Low Complexity: 1 Procedure PT Treatments $Gait Training: 8-22 mins   PT G Codes:        Jillian Warth 08/01/2015, 2:37 PM

## 2015-08-01 NOTE — Anesthesia Postprocedure Evaluation (Signed)
Anesthesia Post Note  Patient: Brenda HerbKathryn Lynn  Procedure(s) Performed: Procedure(s) (LRB): TOTAL LEFT KNEE ARTHROPLASTY (Left)  Patient location during evaluation: PACU Anesthesia Type: Spinal and MAC Level of consciousness: awake and alert Pain management: pain level controlled Vital Signs Assessment: post-procedure vital signs reviewed and stable Respiratory status: spontaneous breathing and respiratory function stable Cardiovascular status: blood pressure returned to baseline and stable Postop Assessment: spinal receding Anesthetic complications: no    Last Vitals:  Filed Vitals:   08/01/15 1000 08/01/15 1015  BP: 121/79 126/77  Pulse: 46 57  Temp: 36.4 C 36.4 C  Resp: 12 14    Last Pain:  Filed Vitals:   08/01/15 1019  PainSc: 3                  Caydence Enck DANIEL

## 2015-08-01 NOTE — Interval H&P Note (Signed)
History and Physical Interval Note:  08/01/2015 7:21 AM  Chipper HerbKathryn [place]  has presented today for surgery, with the diagnosis of LEFT KNEE OA  The various methods of treatment have been discussed with the patient and family. After consideration of risks, benefits and other options for treatment, the patient has consented to  Procedure(s): TOTAL LEFT KNEE ARTHROPLASTY (Left) as a surgical intervention .  The patient's history has been reviewed, patient examined, no change in status, stable for surgery.  I have reviewed the patient's chart and labs.  Questions were answered to the patient's satisfaction.     Shelda PalLIN,Kasara Schomer D

## 2015-08-01 NOTE — Anesthesia Procedure Notes (Signed)
Spinal Patient location during procedure: OR Start time: 08/01/2015 7:32 AM End time: 08/01/2015 7:47 AM Staffing Anesthesiologist: Heather RobertsSINGER, Brenda Beaumier Performed by: anesthesiologist  Preanesthetic Checklist Completed: patient identified, surgical consent, pre-op evaluation, timeout performed, IV checked, risks and benefits discussed and monitors and equipment checked Spinal Block Patient position: sitting Prep: DuraPrep Patient monitoring: cardiac monitor, continuous pulse ox and blood pressure Approach: midline Location: L3-4 Injection technique: single-shot Needle Needle type: Pencan  Needle gauge: 24 G Needle length: 9 cm Additional Notes Functioning IV was confirmed and monitors were applied. Sterile prep and drape, including hand hygiene and sterile gloves were used. The patient was positioned and the spine was prepped. The skin was anesthetized with lidocaine.  Free flow of clear CSF was obtained prior to injecting local anesthetic into the CSF.  The spinal needle aspirated freely following injection.  The needle was carefully withdrawn.  The patient tolerated the procedure well.

## 2015-08-01 NOTE — Transfer of Care (Signed)
Immediate Anesthesia Transfer of Care Note  Patient: Brenda Lynn  Procedure(s) Performed: Procedure(s): TOTAL LEFT KNEE ARTHROPLASTY (Left)  Patient Location: PACU  Anesthesia Type:Spinal  Level of Consciousness: awake, alert  and oriented  Airway & Oxygen Therapy: Patient Spontanous Breathing and Patient connected to face mask oxygen  Post-op Assessment: Report given to RN and Post -op Vital signs reviewed and stable  Post vital signs: Reviewed and stable  Last Vitals:  Filed Vitals:   08/01/15 0545  BP: 103/85  Pulse: 64  Temp: 36.6 C  Resp: 16    Last Pain: There were no vitals filed for this visit.    Patients Stated Pain Goal: 3 (08/01/15 0603)  Complications: No apparent anesthesia complications

## 2015-08-01 NOTE — Progress Notes (Signed)
6th floor aware of pt coming in 20 minutes

## 2015-08-02 LAB — BASIC METABOLIC PANEL
ANION GAP: 5 (ref 5–15)
BUN: 19 mg/dL (ref 6–20)
CO2: 26 mmol/L (ref 22–32)
Calcium: 8.4 mg/dL — ABNORMAL LOW (ref 8.9–10.3)
Chloride: 108 mmol/L (ref 101–111)
Creatinine, Ser: 0.87 mg/dL (ref 0.44–1.00)
GFR calc Af Amer: >60 mL/min (ref >60)
GLUCOSE: 118 mg/dL — AB (ref 65–99)
POTASSIUM: 4.3 mmol/L (ref 3.5–5.1)
Sodium: 139 mmol/L (ref 135–145)

## 2015-08-02 LAB — CBC
HEMATOCRIT: 33 % — AB (ref 36.0–46.0)
HEMOGLOBIN: 11 g/dL — AB (ref 12.0–15.0)
MCH: 31.5 pg (ref 26.0–34.0)
MCHC: 33.3 g/dL (ref 30.0–36.0)
MCV: 94.6 fL (ref 78.0–100.0)
PLATELETS: 175 10*3/uL (ref 150–400)
RBC: 3.49 MIL/uL — AB (ref 3.87–5.11)
RDW: 13.1 % (ref 11.5–15.5)
WBC: 8 10*3/uL (ref 4.0–10.5)

## 2015-08-02 NOTE — Progress Notes (Signed)
Patient ID: Brenda HerbKathryn Lynn, female   DOB: 01-Jan-1961, 55 y.o.   MRN: [id number] Subjective: 1 Day Post-Op Procedure(s) (LRB): TOTAL LEFT KNEE ARTHROPLASTY (Left)    Patient reports pain as moderate.  Pain tolerable, nausea improved.  Ready to go home she feels  Objective:   VITALS:   Filed Vitals:   08/02/15 0545 08/02/15 0933  BP: 98/59 98/58  Pulse: 53 53  Temp: 98.4 F (36.9 C) 98.2 F (36.8 C)  Resp: 16 16    Neurovascular intact Incision: dressing C/D/I  LABS  Recent Labs  08/02/15 0442  HGB 11.0*  HCT 33.0*  WBC 8.0  PLT 175     Recent Labs  08/02/15 0442  NA 139  K 4.3  BUN 19  CREATININE 0.87  GLUCOSE 118*    No results for input(s): LABPT, INR in the last 72 hours.   Assessment/Plan: 1 Day Post-Op Procedure(s) (LRB): TOTAL LEFT KNEE ARTHROPLASTY (Left)   Advance diet Up with therapy Discharge home with home health today after therapy  RTC in 2 weeks Reviewed goals

## 2015-08-02 NOTE — Progress Notes (Signed)
Discharge instructions given to patient along with prescriptions. Questions answered.  Walker delivered patient and husband aware that walker is for pt discharge.

## 2015-08-02 NOTE — Care Management Note (Signed)
Case Management Note  Patient Details  Name: Kaedynce Tapp MRN: [id number] Date of Birth: 1960-07-08  Subjective/Objective:                  TOTAL LEFT KNEE ARTHROPLASTY (Left) Action/Plan: Discharge planning Expected Discharge Date:  08/04/15               Expected Discharge Plan:  [place]  In-House Referral:     Discharge planning Services  CM Consult  Post Acute Care Choice:  Home Health Choice offered to:  Patient  DME Arranged:  Walker rolling DME Agency:  [place]:  PT [place] Agency:  [facility])  Status of Service:  Completed, signed off  If discussed at [employer] of Stay Meetings, dates discussed:    Additional Comments: CM met with pt in room to offer choice of home health agency.  Pt chooses [facility] to render HHPT.  Referral given to [employer], Tim.  CM called [facility] DME rep, Germaine to please deliver the rolling walker to room so pt can discharge.  No other CM needs were communicated.  Dellie Catholic, RN 08/02/2015, 9:20 AM

## 2015-08-02 NOTE — Progress Notes (Signed)
Physical Therapy Treatment Patient Details Name: Brenda HerbKathryn Lynn MRN: [id number] DOB: 1960/10/26 Today's Date: 08/02/2015    History of Present Illness s/p L TKA    PT Comments    Pt will benefit from continued to PT, doing well; will need to practice stairs  Prior to D/C  Follow Up Recommendations  Home health PT     Equipment Recommendations  Rolling walker with 5" wheels    Recommendations for Other Services       Precautions / Restrictions Precautions Precautions: Knee Restrictions Weight Bearing Restrictions: No Other Position/Activity Restrictions: WBAT    Mobility  Bed Mobility Overal bed mobility: Needs Assistance Bed Mobility: Supine to Sit;Sit to Supine     Supine to sit: Min guard;Supervision Sit to supine: Min guard;Supervision   General bed mobility comments: used UEs to self assist LLE on/off bed  Transfers Overall transfer level: Needs assistance Equipment used: Rolling walker (2 wheeled) Transfers: Sit to/from Stand Sit to Stand: Supervision         General transfer comment: cues for hand placement  Ambulation/Gait Ambulation/Gait assistance: Supervision Ambulation Distance (Feet): 85 Feet Assistive device: Rolling walker (2 wheeled) Gait Pattern/deviations: Step-to pattern;Antalgic     General Gait Details: cues for posture, sequence, RW position   Stairs            Wheelchair Mobility    Modified Rankin (Stroke Patients Only)       Balance                                    Cognition Arousal/Alertness: Awake/alert Behavior During Therapy: WFL for tasks assessed/performed Overall Cognitive Status: Within Functional Limits for tasks assessed                      Exercises Total Joint Exercises Ankle Circles/Pumps: AROM;Both;10 reps Quad Sets: AROM;Strengthening;Both;10 reps Short Arc QuadBarbaraann Boys: AAROM;Left;10 reps Heel Slides: AROM;AAROM;Left;10 reps Hip ABduction/ADduction: AROM;AAROM;Left;10  reps Straight Leg Raises: AAROM;Left;10 reps Goniometric ROM: 10 to 80*    General Comments        Pertinent Vitals/Pain Pain Assessment: 0-10 Pain Score: 3  Faces Pain Scale: Hurts a little bit Pain Location: L knee Pain Descriptors / Indicators: Grimacing;Guarding;Aching;Sore Pain Intervention(s): Limited activity within patient's tolerance;Monitored during session;Premedicated before session;Ice applied    Home Living Family/patient expects to be discharged to:: Private residence Living Arrangements: Spouse/significant other Available Help at Discharge: Family                Prior Function Level of Independence: Independent          PT Goals (current goals can now be found in the care plan section) Acute Rehab PT Goals Patient Stated Goal: home PT Goal Formulation: With patient Time For Goal Achievement: 08/05/15 Potential to Achieve Goals: Good Progress towards PT goals: Progressing toward goals    Frequency  7X/week    PT Plan Current plan remains appropriate    Co-evaluation             End of Session Equipment Utilized During Treatment: Gait belt Activity Tolerance: Patient tolerated treatment well Patient left: in bed;with call bell/phone within reach;with family/visitor present     Time: [id number] PT Time Calculation (min) (ACUTE ONLY): 19 min  Charges:  $Therapeutic Exercise: 8-22 mins                    G Codes:  [facility] Hospital[facility]ILL[facility],Tajae Maiolo 08/02/2015, 10:50 AM

## 2015-08-02 NOTE — Progress Notes (Signed)
   08/02/15 1200  PT Visit Information  Assistance Needed +1  History of Present Illness s/p L TKA  Subjective Data  Patient Stated Goal home  Precautions  Precautions Knee  Restrictions  Weight Bearing Restrictions No  Other Position/Activity Restrictions WBAT  Pain Assessment  Pain Assessment 0-10  Pain Score 2  Pain Location L knee  Pain Descriptors / Indicators Aching;Sore  Pain Intervention(s) Limited activity within patient's tolerance;Monitored during session;Premedicated before session  Cognition  Arousal/Alertness Awake/alert  Behavior During Therapy WFL for tasks assessed/performed  Overall Cognitive Status Within Functional Limits for tasks assessed  Bed Mobility  Overal bed mobility Needs Assistance  Bed Mobility Supine to Sit;Sit to Supine  Supine to sit Supervision  Sit to supine Supervision  General bed mobility comments used UEs to self assist LLE on/off bed  Transfers  Overall transfer level Needs assistance  Equipment used Rolling walker (2 wheeled)  Transfers Sit to/from Stand  Sit to Stand Supervision  General transfer comment cues for hand placement  Ambulation/Gait  Ambulation/Gait assistance Supervision  Ambulation Distance (Feet) 40 Feet  Assistive device Rolling walker (2 wheeled)  Gait Pattern/deviations Step-to pattern;Step-through pattern  General Gait Details cues for posture, sequence, RW position, knee extension in stance on L  Stairs Yes  Stairs assistance Min guard  Stair Management One rail Right;Step to pattern;Backwards;Forwards;With crutches;With walker  Number of Stairs 5 (1)  General stair comments up/down one step with walker;  up/down 5 steps with crutch and rail  PT - End of Session  Equipment Utilized During Treatment Gait belt  Activity Tolerance Patient tolerated treatment well  Patient left in bed;with call bell/phone within reach;with family/visitor present  Nurse Communication Mobility status  PT - Assessment/Plan  PT  Plan Current plan remains appropriate  PT Frequency (ACUTE ONLY) 7X/week  Follow Up Recommendations Home health PT  PT equipment Rolling walker with 5" wheels  PT Goal Progression  Progress towards PT goals Progressing toward goals  Acute Rehab PT Goals  Time For Goal Achievement 08/05/15  Potential to Achieve Goals Good  PT Time Calculation  PT Start Time (ACUTE ONLY) 1139  PT Stop Time (ACUTE ONLY) 1153  PT Time Calculation (min) (ACUTE ONLY) 14 min  PT General Charges  $$ ACUTE PT VISIT 1 Procedure  PT Treatments  $Gait Training 8-22 mins

## 2015-08-02 NOTE — Evaluation (Signed)
Occupational Therapy Evaluation Patient Details Name: Brenda HerbKathryn Lynn MRN: [id number] DOB: [birth year] Today's Date: 08/02/2015    History of Present Illness s/p L TKA   Clinical Impression   This 55 year old female was admitted for the above sx. All education was completed. No further OT is needed at this time    Follow Up Recommendations  No OT follow up    Equipment Recommendations  None recommended by OT (doesn't want 3:1)    Recommendations for Other Services       Precautions / Restrictions Precautions Precautions: Knee Restrictions Weight Bearing Restrictions: No      Mobility Bed Mobility         Supine to sit: Supervision     General bed mobility comments: used arms to assist leg on/off bed  Transfers   Equipment used: Rolling walker (2 wheeled) Transfers: Sit to/from Stand Sit to Stand: Supervision              Balance                                            ADL Overall ADL's : Needs assistance/impaired     Grooming: Supervision/safety;Standing       Lower Body Bathing: Supervison/ safety;Sit to/from stand       Lower Body Dressing: Supervision/safety;Sit to/from stand   Toilet Transfer: Min guard;Ambulation;Comfort height toilet       Tub/ Shower Transfer: Min guard;Ambulation;Walk-in shower     General ADL Comments: pt used to work as an Pharmacist, [employer].  She believes she will be able to get to standard commode without difficulty:  min guard from comfort height. She doesn't have a chair and may place a resin chair in shower stall.  She tends not to put full weight on LLE     Vision     Perception     Praxis      Pertinent Vitals/Pain Pain Assessment: Faces Faces Pain Scale: Hurts a little bit Pain Location: L knee Pain Descriptors / Indicators: Sore Pain Intervention(s): Limited activity within patient's tolerance;Monitored during session;Repositioned;Ice applied     Hand Dominance      Extremity/Trunk Assessment Upper Extremity Assessment Upper Extremity Assessment: Overall WFL for tasks assessed           Communication Communication Communication: No difficulties   Cognition Arousal/Alertness: Awake/alert Behavior During Therapy: WFL for tasks assessed/performed Overall Cognitive Status: Within Functional Limits for tasks assessed                     General Comments       Exercises       Shoulder Instructions      Home Living Family/patient expects to be discharged to:: Private residence Living Arrangements: Spouse/significant other Available Help at Discharge: Family               Bathroom Shower/Tub: Walk-in Human resources officershower   Bathroom Toilet: Standard                Prior Functioning/Environment Level of Independence: Independent             OT Diagnosis: Acute pain   OT Problem List:     OT Treatment/Interventions:      OT Goals(Current goals can be found in the care plan section) Acute Rehab OT Goals Patient Stated Goal: home OT Goal Formulation: All assessment and  education complete, DC therapy  OT Frequency:     Barriers to D/C:            Co-evaluation              End of Session    Activity Tolerance: Patient tolerated treatment well Patient left: in bed;with call bell/phone within reach;with family/visitor present   Time: 1000-1020 OT Time Calculation (min): 20 min Charges:  OT General Charges $OT Visit: 1 Procedure OT Evaluation $OT Eval Low Complexity: 1 Procedure G-Codes:    Analys Ryden 08/02/2015, 10:39 AM Marica OtterMaryellen Jenyfer Lynn, OTR/L [phone] 08/02/2015

## 2015-08-05 NOTE — Discharge Summary (Signed)
Physician Discharge Summary  Patient ID: Brenda Lynn MRN: [id number] DOB/AGE: 1960/08/19 55 y.o.  Admit date: 08/01/2015 Discharge date: 08/02/2015   Procedures:  Procedure(s) (LRB): TOTAL LEFT KNEE ARTHROPLASTY (Left)  Attending Physician:  Dr. Durene Romans   Admission Diagnoses:   Left knee primary OA / pain  Discharge Diagnoses:  Principal Problem:   S/P left TKA Active Problems:   S/P knee replacement  Past Medical History  Diagnosis Date  . Hernia     inguinal hernia at present-asymptomatic.  . Varicose veins     right leg greater than left- wears compression hose  . Hx of seasonal allergies   . Arthritis     osteoarthritis- hands, knees  . 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 female, has a history of pain and functional disability in the left knee due to arthritis and has failed non-surgical conservative treatments for greater than 12 weeks to include NSAID's and/or analgesics and activity modification. Onset of symptoms was gradual, starting 1+ years ago with gradually worsening course since that time. The patient noted no past surgery on the left knee(s). Patient currently rates pain in the left knee(s) at 9 out of 10 with activity. Patient has night pain, worsening of pain with activity and weight bearing, pain that interferes with activities of daily living, pain with passive range of motion, crepitus and joint swelling. Patient has evidence of periarticular osteophytes and joint space narrowing by imaging studies. There is no active infection. Risks, benefits and expectations were discussed with the patient. Risks including but not limited to the risk of anesthesia, blood clots, nerve damage, blood vessel damage, failure of the prosthesis, infection and up to and including death. Patient understand the risks, benefits and expectations and wishes to proceed with surgery.   PCP: Leanor Rubenstein, MD   Discharged Condition:  good  Hospital Course:  Patient underwent the above stated procedure on 08/01/2015. Patient tolerated the procedure well and brought to the recovery room in good condition and subsequently to the floor.  POD #1 BP: 98/58 ; Pulse: 53 ; Temp: 98.2 F (36.8 C) ; Resp: 16 Patient reports pain as moderate. Pain tolerable, nausea improved. She feels ready to go home. Neurovascular intact and incision: dressing C/D/I  LABS  Basename    HGB     11.0  HCT     33.0    Discharge Exam: General appearance: alert, cooperative and no distress Extremities: Homans sign is negative, no sign of DVT, no edema, redness or tenderness in the calves or thighs and no ulcers, gangrene or trophic changes  Disposition: Home with follow up in 2 weeks   Follow-up Information    Follow up with Shelda Pal, MD. Schedule an appointment as soon as possible for a visit in 2 weeks.   Specialty:  Orthopedic Surgery   Contact information:   [address] [place] [address] [phone]       Follow up with [facility].   Why:  home health physical therapy   Contact information:   [address] [place] [address] [phone]       Follow up with [facility].   Why:  rolling walker   Contact information:   [address] [place] [address] [phone]       Discharge Instructions    Call MD / Call 911  Complete by:  As directed   If you experience chest pain or shortness of breath, CALL 911 and be transported to the hospital emergency room.  If you develope a fever above 101 F, pus (white drainage) or increased drainage or redness at the wound, or calf pain, call your surgeon's office.     Change dressing    Complete by:  As directed   Maintain surgical dressing until follow up in the clinic. If the edges start to pull up, may reinforce with tape. If the dressing is no longer working, may remove and cover with gauze and tape,  but must keep the area dry and clean.  Call with any questions or concerns.     Constipation Prevention    Complete by:  As directed   Drink plenty of fluids.  Prune juice may be helpful.  You may use a stool softener, such as Colace (over the counter) 100 mg twice a day.  Use MiraLax (over the counter) for constipation as needed.     Diet - low sodium heart healthy    Complete by:  As directed      Discharge instructions    Complete by:  As directed   Maintain surgical dressing until follow up in the clinic. If the edges start to pull up, may reinforce with tape. If the dressing is no longer working, may remove and cover with gauze and tape, but must keep the area dry and clean.  [facility] Call with any questions or concerns.     Increase activity slowly as tolerated    Complete by:  As directed   Weight bearing as tolerated with assist device (walker, cane, etc) as directed, use it as long as suggested by your surgeon or therapist, typically at least 4-6 weeks.     TED hose    Complete by:  As directed   Use stockings (TED hose) for 2 weeks on both leg(s).  You may remove them at night for sleeping.             Medication List    STOP taking these medications        ibuprofen 200 MG tablet  Commonly known as:  ADVIL,MOTRIN      TAKE these medications        aspirin 325 MG EC tablet  Take 1 tablet (325 mg total) by mouth 2 (two) times daily.     celecoxib 200 MG capsule  Commonly known as:  CELEBREX  Take 1 capsule (200 mg total) by mouth every 12 (twelve) hours.     docusate sodium 100 MG capsule  Commonly known as:  COLACE  Take 1 capsule (100 mg total) by mouth 2 (two) times daily.     ferrous sulfate 325 (65 FE) MG tablet  Take 1 tablet (325 mg total) by mouth 3 (three) times daily after meals.     HYDROcodone-acetaminophen 7.5-325 MG tablet  Commonly known as:  NORCO  Take 1-2 tablets by mouth every 4 (four) hours as needed  for moderate pain.     loratadine 10 MG tablet  Commonly known as:  CLARITIN  Take 10 mg by mouth daily as needed for allergies.     methocarbamol 500 MG tablet  Commonly known as:  ROBAXIN  Take 1 tablet (500 mg total) by mouth every 6 (six) hours as needed for muscle spasms.     polyethylene glycol packet  Commonly known as:  MIRALAX / GLYCOLAX  Take 17 g by mouth 2 (two) times daily.         Signed: Anastasio AuerbachMatthew S. Brenda Carolan   PA-C  08/05/2015, 1:16 PM

## 2015-09-30 ENCOUNTER — Encounter: Payer: Self-pay | Admitting: Vascular Surgery

## 2015-10-04 ENCOUNTER — Ambulatory Visit (INDEPENDENT_AMBULATORY_CARE_PROVIDER_SITE_OTHER): Payer: BLUE CROSS/BLUE SHIELD | Admitting: Vascular Surgery

## 2015-10-04 ENCOUNTER — Encounter: Payer: Self-pay | Admitting: Vascular Surgery

## 2015-10-04 VITALS — BP 123/78 | HR 72 | Temp 98.5°F | Resp 18 | Ht 70.75 in | Wt 158.5 lb

## 2015-10-04 DIAGNOSIS — I83893 Varicose veins of bilateral lower extremities with other complications: Secondary | ICD-10-CM | POA: Diagnosis not present

## 2015-10-04 NOTE — Progress Notes (Signed)
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  HPI: Brenda Lynn is a 55 y.o. female she has been compliant with her graduated compression garments and elevation. Continues to have severe discomfort most likely over her legs from below her knee distally and over a large varicosities in her medial thigh. She has both symptoms of achy sensation with prolonged standing and swelling and also has pain directly over her very large varicosities.  Past Medical History:  Diagnosis Date  . Arthritis    osteoarthritis- hands, knees  . 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 Hernia    inguinal hernia at present-asymptomatic.  Marland Kitchen. Hx of seasonal allergies   . Varicose veins    right leg greater than left- wears compression hose    Family History  Problem Relation Age of Onset  . Cancer Father     leukemia  . Cancer Paternal Aunt     breast  . Cancer Maternal Grandfather     unknown to pt / ?skin    SOCIAL HISTORY: Social History  Substance Use Topics  . Smoking status: Never Smoker  . Smokeless tobacco: Never Used  . Alcohol use No     Comment: rare    Allergies  Allergen Reactions  . Demerol [Meperidine] Nausea And Vomiting    Current Outpatient Prescriptions  Medication Sig Dispense Refill  . loratadine (CLARITIN) 10 MG tablet Take 10 mg by mouth daily as needed for allergies.     . celecoxib (CELEBREX) 200 MG capsule Take 1 capsule (200 mg total) by mouth every 12 (twelve) hours. (Patient not taking: Reported on 10/04/2015) 60 capsule 0  . docusate sodium (COLACE) 100 MG capsule Take 1 capsule (100 mg total) by mouth 2 (two) times daily. (Patient not taking: Reported on 10/04/2015) 10 capsule 0  . ferrous sulfate 325 (65 FE) MG tablet Take 1 tablet (325 mg total) by mouth 3 (three) times daily after meals. (Patient not taking: Reported on 10/04/2015)  3  .  HYDROcodone-acetaminophen (NORCO) 7.5-325 MG tablet Take 1-2 tablets by mouth every 4 (four) hours as needed for moderate pain. (Patient not taking: Reported on 10/04/2015) 100 tablet 0  . methocarbamol (ROBAXIN) 500 MG tablet Take 1 tablet (500 mg total) by mouth every 6 (six) hours as needed for muscle spasms. (Patient not taking: Reported on 10/04/2015) 40 tablet 0  . polyethylene glycol (MIRALAX / GLYCOLAX) packet Take 17 g by mouth 2 (two) times daily. (Patient not taking: Reported on 10/04/2015) 14 each 0   No current facility-administered medications for this visit.     REVIEW OF SYSTEMS:  [X]  denotes positive finding, [ ]  denotes negative finding Cardiac  Comments:  Chest pain or chest pressure:    Shortness of breath upon exertion:    Short of breath when lying flat:    Irregular heart rhythm:        Vascular    Pain in calf, thigh, or hip brought on by ambulation:    Pain in feet at night that wakes you up from your sleep:     Blood clot in your veins:    Leg swelling:  x       Pulmonary    Oxygen at home:    Productive cough:     Wheezing:         Neurologic    Sudden weakness in arms or legs:  Sudden numbness in arms or legs:     Sudden onset of difficulty speaking or slurred speech:    Temporary loss of vision in one eye:     Problems with dizziness:         Gastrointestinal    Blood in stool:     Vomited blood:         Genitourinary    Burning when urinating:     Blood in urine:        Psychiatric    Major depression:         Hematologic    Bleeding problems:    Problems with blood clotting too easily:        Skin    Rashes or ulcers:        Constitutional    Fever or chills:      PHYSICAL EXAM: Vitals:   10/04/15 1020  BP: 123/78  Pulse: 72  Resp: 18  Temp: 98.5 F (36.9 C)  TempSrc: Oral  SpO2: 100%  Weight: 158 lb 8 oz (71.9 kg)  Height: 5' 10.75" (1.797 m)    GENERAL: The patient is a well-nourished female, in no acute distress.  The vital signs are documented above. VASCULAR: 2+ dorsalis pedis pulses bilaterally PULMONARY: There is good air exchang MUSCULOSKELETAL: There are no major deformities or cyanosis. NEUROLOGIC: No focal weakness or paresthesias are detected. SKIN: There are no ulcers or rashes noted. Marked changes of venous hypertension bilaterally right greater than left with hemosiderin deposits and lines ectasia diffusely over her legs mostly below her knee PSYCHIATRIC: The patient has a normal affect.  DATA:   Again reviewed her duplex from 05/09/2015 and reimaged her saphenous vein with SonoSite. She does have gross reflux in her right great saphenous vein extending into these large varicosities.  MEDICAL ISSUES:  Failed conservative therapy despite elevation graduated compression garments and ibuprofen use. She is quite active and standing as her occupation requires. Making this very difficult for her. Have recommended proceeding with laser ablation of her right great saphenous vein and stab phlebectomy of multiple large varicosities. Explained this as an outpatient procedure also explained the very slight risk of DVT associated with the procedure. She wishes to proceed as soon as possible. He understands this as an outpatient procedure under local anesthesia.    Gretta Began [facility] [phone]

## 2015-10-04 NOTE — Progress Notes (Signed)
Problems with Activities of Daily Living Secondary to Leg Pain  1. Brenda Lynn works for [employer] and states that her job requires prolonged standing. She has difficulty with leg swelling and pain with prolonged standing.         Failure of  Conservative Therapy:  1. Worn 20-30 mm Hg thigh high compression hose >3 months with no relief of symptoms.  2. Frequently elevates legs-no relief of symptoms  3. Taken Ibuprofen 600 Mg TID with no relief of symptoms.

## 2015-10-20 ENCOUNTER — Other Ambulatory Visit: Payer: Self-pay | Admitting: *Deleted

## 2015-10-20 DIAGNOSIS — I83893 Varicose veins of bilateral lower extremities with other complications: Secondary | ICD-10-CM

## 2015-12-07 ENCOUNTER — Encounter: Payer: Self-pay | Admitting: Vascular Surgery

## 2015-12-08 ENCOUNTER — Ambulatory Visit (INDEPENDENT_AMBULATORY_CARE_PROVIDER_SITE_OTHER): Payer: BLUE CROSS/BLUE SHIELD | Admitting: Vascular Surgery

## 2015-12-08 ENCOUNTER — Encounter: Payer: Self-pay | Admitting: Vascular Surgery

## 2015-12-08 VITALS — BP 124/73 | HR 67 | Temp 97.7°F | Resp 16 | Ht 70.75 in | Wt 158.0 lb

## 2015-12-08 DIAGNOSIS — I872 Venous insufficiency (chronic) (peripheral): Secondary | ICD-10-CM

## 2015-12-08 HISTORY — PX: ENDOVENOUS ABLATION SAPHENOUS VEIN W/ LASER: SUR449

## 2015-12-08 NOTE — Progress Notes (Signed)
     Laser Ablation Procedure    Date: 12/08/2015   Brenda Lynn DOB:03-20-1960  Consent signed: Yes    Surgeon:  Dr. Tawanna Coolerodd Xzayvion Vaeth  Procedure: Laser Ablation: right Greater Saphenous Vein  BP 124/73 (BP Location: Left Arm, Patient Position: Sitting, Cuff Size: Normal)   Pulse 67   Temp 97.7 F (36.5 C) (Oral)   Resp 16   Ht 5' 10.75" (1.797 m)   Wt 158 lb (71.7 kg)   SpO2 100%   BMI 22.19 kg/m   Tumescent Anesthesia: 500 cc 0.9% NaCl with 50 cc Lidocaine HCL with 1% Epi and 15 cc 8.4% NaHCO3  Local Anesthesia: 3.5 cc Lidocaine HCL and NaHCO3 (ratio 2:1)  15 watts continuous mode        Total energy: 3530 Joules   Total time: 3:55    Stab Phlebectomy: 10-20 Sites: Thigh and Calf right leg  Patient tolerated procedure well    Description of Procedure:  After marking the course of the secondary varicosities, the patient was placed on the operating table in the supine position, and the right leg was prepped and draped in sterile fashion.   Local anesthetic was administered and under ultrasound guidance the saphenous vein was accessed with a micro needle and guide wire; then the mirco puncture sheath was placed.  A guide wire was inserted saphenofemoral junction , followed by a 5 french sheath.  The position of the sheath and then the laser fiber below the junction was confirmed using the ultrasound.  Tumescent anesthesia was administered along the course of the saphenous vein using ultrasound guidance. The patient was placed in Trendelenburg position and protective laser glasses were placed on patient and staff, and the laser was fired at 15 watts continuous mode advancing 1-412mm/second for a total of 3530 joules.   For stab phlebectomies, local anesthetic was administered at the previously marked varicosities, and tumescent anesthesia was administered around the vessels.  Ten to 20 stab wounds were made using the tip of an 11 blade. And using the vein hook, the phlebectomies  were performed using a hemostat to avulse the varicosities.  Adequate hemostasis was achieved.     Steri strips were applied to the stab wounds and ABD pads and thigh high compression stockings were applied.  Ace wrap bandages were applied over the phlebectomy sites and at the top of the saphenofemoral junction. Blood loss was less than 15 cc.  The patient ambulated out of the operating room having tolerated the procedure well.  Uneventful ablation from mid calf to just below the saphenofemoral junction. Had the stab phlebectomy of large varicosities in her medial thigh and medial calf. Will be seen again in one week for follow-up

## 2015-12-09 ENCOUNTER — Telehealth: Payer: Self-pay | Admitting: *Deleted

## 2015-12-09 ENCOUNTER — Encounter: Payer: Self-pay | Admitting: Vascular Surgery

## 2015-12-09 NOTE — Telephone Encounter (Signed)
    12/09/2015  Time: 9:31 AM   Patient Name: Brenda HerbKathryn Goddard  Patient of: T.F. Early  Procedure:Laser Ablation and stab phlebectomy 10-20 incisions (right leg)  right  12-08-2015  Reached patient at home and checked  Her status  Yes    Comments/Actions Taken: Mrs. Vonita Mosseterson states no problems with right leg pain or swelling.  She states no problem with bleeding/oozing from incisions on right leg.  Reviewed post procedural instructions with her and reminded her of post LA duplex and VV follow up appointment with Dr. Arbie CookeyEarly on 12-15-2015.      @SIGNATURE @

## 2015-12-14 ENCOUNTER — Encounter: Payer: Self-pay | Admitting: Vascular Surgery

## 2015-12-15 ENCOUNTER — Encounter: Payer: Self-pay | Admitting: Vascular Surgery

## 2015-12-15 ENCOUNTER — Ambulatory Visit (HOSPITAL_COMMUNITY)
Admission: RE | Admit: 2015-12-15 | Discharge: 2015-12-15 | Disposition: A | Discharge status: Home / Self Care | Payer: BLUE CROSS/BLUE SHIELD | Source: Ambulatory Visit | Attending: Vascular Surgery | Admitting: Vascular Surgery

## 2015-12-15 ENCOUNTER — Ambulatory Visit (INDEPENDENT_AMBULATORY_CARE_PROVIDER_SITE_OTHER): Payer: BLUE CROSS/BLUE SHIELD | Admitting: Vascular Surgery

## 2015-12-15 VITALS — BP 115/75 | HR 60 | Temp 97.9°F | Resp 18 | Ht 70.75 in | Wt 158.6 lb

## 2015-12-15 DIAGNOSIS — I872 Venous insufficiency (chronic) (peripheral): Secondary | ICD-10-CM

## 2015-12-15 DIAGNOSIS — Z9889 Other specified postprocedural states: Secondary | ICD-10-CM | POA: Insufficient documentation

## 2015-12-15 DIAGNOSIS — I83893 Varicose veins of bilateral lower extremities with other complications: Secondary | ICD-10-CM

## 2015-12-15 NOTE — Progress Notes (Signed)
   Patient name: Brenda Lynn MRN: [id number] DOB: 01-May-1960 Sex: female  REASON FOR VISIT: One-week follow-up of laser ablation of right great saphenous vein  HPI: Brenda Lynn is a 55 y.o. female here today for follow-up. She did quite well and has been compliant with her compression garment. She has mild discomfort and mild bruising.  Current Outpatient Prescriptions  Medication Sig Dispense Refill  . IBUPROFEN PO Take by mouth.    . celecoxib (CELEBREX) 200 MG capsule Take 1 capsule (200 mg total) by mouth every 12 (twelve) hours. (Patient not taking: Reported on 12/15/2015) 60 capsule 0  . docusate sodium (COLACE) 100 MG capsule Take 1 capsule (100 mg total) by mouth 2 (two) times daily. (Patient not taking: Reported on 12/15/2015) 10 capsule 0  . ferrous sulfate 325 (65 FE) MG tablet Take 1 tablet (325 mg total) by mouth 3 (three) times daily after meals. (Patient not taking: Reported on 12/15/2015)  3  . HYDROcodone-acetaminophen (NORCO) 7.5-325 MG tablet Take 1-2 tablets by mouth every 4 (four) hours as needed for moderate pain. (Patient not taking: Reported on 12/15/2015) 100 tablet 0  . loratadine (CLARITIN) 10 MG tablet Take 10 mg by mouth daily as needed for allergies.     . methocarbamol (ROBAXIN) 500 MG tablet Take 1 tablet (500 mg total) by mouth every 6 (six) hours as needed for muscle spasms. (Patient not taking: Reported on 12/15/2015) 40 tablet 0  . polyethylene glycol (MIRALAX / GLYCOLAX) packet Take 17 g by mouth 2 (two) times daily. (Patient not taking: Reported on 12/15/2015) 14 each 0   No current facility-administered medications for this visit.      PHYSICAL EXAM: Vitals:   12/15/15 1001  BP: 115/75  Pulse: 60  Resp: 18  Temp: 97.9 F (36.6 C)  TempSrc: Oral  SpO2: 100%  Weight: 158 lb 9.6 oz (71.9 kg)  Height: 5' 10.75" (1.797 m)    GENERAL: The patient is a well-nourished female, in no acute distress. The vital signs  are documented above. Her right leg reveals a good result from the stab phlebectomy of the large temperature varicosities. Steri-Strips are beginning to fall off and was instructed on the care of these. Does have mild to moderate bruising in her thigh and calf.  Venous duplex today was reviewed with the patient. This reveals occlusion of her saphenous vein from the proximal calf to 7 mm below the saphenofemoral junction and no evidence of DVT  MEDICAL ISSUES: Excellent Ascencion Coye result from laser ablation of symptomatic venous hypertension of her saphenous vein. Will continue her stocking use for 1 week and then see us again on as-needed basis   Larina Earthlyodd F. Kemani Heidel, MD [facility] Tel [phone] Pager [phone]

## 2017-12-24 ENCOUNTER — Ambulatory Visit: Payer: Self-pay

## 2017-12-24 ENCOUNTER — Other Ambulatory Visit: Payer: Self-pay | Admitting: Family Medicine

## 2017-12-24 DIAGNOSIS — M79645 Pain in left finger(s): Secondary | ICD-10-CM

## 2019-04-18 ENCOUNTER — Ambulatory Visit: Payer: BC Managed Care – PPO | Attending: Internal Medicine

## 2019-04-18 DIAGNOSIS — Z23 Encounter for immunization: Secondary | ICD-10-CM

## 2019-04-18 NOTE — Progress Notes (Signed)
   Covid-19 Vaccination Clinic  Name:  Brenda Lynn    MRN: [id number] DOB: 1960/09/18  04/18/2019  Brenda Lynn was observed post Covid-19 immunization for 15 minutes without incident. She was provided with Vaccine Information Sheet and instruction to access the V-Safe system.   Brenda Lynn was instructed to call 911 with any severe reactions post vaccine: Marland Kitchen Difficulty breathing  . Swelling of face and throat  . A fast heartbeat  . A bad rash all over body  . Dizziness and weakness   Immunizations Administered    Name Date Dose VIS Date Route   Pfizer COVID-19 Vaccine 04/18/2019  1:59 PM 0.3 mL 01/16/2019 Intramuscular   Manufacturer: [employer], [employer]   Lot: [id number]   NDC: [address]-9

## 2019-05-12 ENCOUNTER — Ambulatory Visit: Payer: BC Managed Care – PPO | Attending: Internal Medicine

## 2019-05-12 DIAGNOSIS — Z23 Encounter for immunization: Secondary | ICD-10-CM

## 2019-05-12 NOTE — Progress Notes (Signed)
   Covid-19 Vaccination Clinic  Name:  Brenda Lynn    MRN: [id number] DOB: [birth year]  05/12/2019  [name] was observed post Covid-19 immunization for 15 minutes without incident. She was provided with Vaccine Information Sheet and instruction to access the V-Safe system.   [name] was instructed to call 911 with any severe reactions post vaccine: Marland Kitchen Difficulty breathing  . Swelling of face and throat  . A fast heartbeat  . A bad rash all over body  . Dizziness and weakness   Immunizations Administered    Name Date Dose VIS Date Route   Pfizer COVID-19 Vaccine 05/12/2019  1:59 PM 0.3 mL 01/16/2019 Intramuscular   Manufacturer: [employer], [employer]   Lot: [id number]   NDC: [address]-5

## 2020-03-08 ENCOUNTER — Other Ambulatory Visit: Payer: Self-pay

## 2020-03-08 ENCOUNTER — Encounter (HOSPITAL_COMMUNITY): Payer: Self-pay

## 2020-03-08 ENCOUNTER — Ambulatory Visit (HOSPITAL_COMMUNITY)
Admission: EM | Admit: 2020-03-08 | Discharge: 2020-03-08 | Disposition: A | Discharge status: Home / Self Care | Payer: BC Managed Care – PPO | Attending: Emergency Medicine | Admitting: Emergency Medicine

## 2020-03-08 DIAGNOSIS — R310 Gross hematuria: Secondary | ICD-10-CM | POA: Insufficient documentation

## 2020-03-08 DIAGNOSIS — R300 Dysuria: Secondary | ICD-10-CM | POA: Insufficient documentation

## 2020-03-08 LAB — POCT URINALYSIS DIPSTICK, ED / UC
Glucose, UA: 500 mg/dL — AB
Ketones, ur: 40 mg/dL — AB
Nitrite: POSITIVE — AB
Protein, ur: >=300 mg/dL — AB
Specific Gravity, Urine: 1.01 (ref 1.005–1.030)
Urobilinogen, UA: >=8 mg/dL (ref 0.0–1.0)
pH: 5 (ref 5.0–8.0)

## 2020-03-08 LAB — CBG MONITORING, ED: Glucose-Capillary: 94 mg/dL (ref 70–99)

## 2020-03-08 MED ORDER — PHENAZOPYRIDINE HCL 200 MG PO TABS: 200.0000 mg | ORAL_TABLET | Freq: Three times a day (TID) | ORAL | 0 refills | Status: DC

## 2020-03-08 MED ORDER — SULFAMETHOXAZOLE-TRIMETHOPRIM 800-160 MG PO TABS: 1.0000 | ORAL_TABLET | Freq: Two times a day (BID) | ORAL | 0 refills | Status: AC

## 2020-03-08 NOTE — Discharge Instructions (Addendum)
Treating you for urinary tract infection, possible kidney infection You may have passed a kidney stone as we talked about.  Pyridium as needed for discomfort You can also take tylenol extra strength for the pain Drink plenty of water.  Follow up as needed for continued or worsening symptoms

## 2020-03-08 NOTE — ED Provider Notes (Signed)
[facility]    CSN: [id number] Arrival date & time: 03/08/20  1613      History   Chief Complaint Chief Complaint  Patient presents with  . UTI  . Urinary Frequency  . Abdominal Pain    lower  . Urinary Urgency  . Hematuria  . Dysuria    HPI Quida Glasser is a 60 y.o. female.   Patient is a 60 year old female who presents today with complaints of dysuria, urinary urgency x1 day.  She has had urinary frequency and noticed blood in her urine.  History of UTIs.  Prior to this 3 days ago had right flank pain.  This is subsided.  Mild lower abdominal discomfort and suprapubic pressure.  No fevers, chills, nausea or vomiting     Past Medical History:  Diagnosis Date  . Arthritis    osteoarthritis- hands, knees  . 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 Hernia    inguinal hernia at present-asymptomatic.  Marland Kitchen Hx of seasonal allergies   . Varicose veins    right leg greater than left- wears compression hose    Patient Active Problem List   Diagnosis Date Noted  . 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 S/P knee replacement 08/01/2015    Past Surgical History:  Procedure Laterality Date  . APPENDECTOMY  age 59   open  . ENDOVENOUS ABLATION SAPHENOUS VEIN W/ LASER Right 12/08/2015   EVLA right greater saphenous vein and stab phlebectomy 10-20 incisions right leg by Gretta Began MD  . INGUINAL HERNIA REPAIR Left 05/14/2012   Procedure: HERNIA REPAIR INGUINAL ADULT;  Surgeon: Lodema Pilot, DO;  Location: [facility];  Service: General;  Laterality: Left;  . INSERTION OF MESH Left 05/14/2012   Procedure: INSERTION OF MESH;  Surgeon: Lodema Pilot, DO;  Location: [facility];  Service: General;  Laterality: Left;  . neck plastic surgery     to remove birthmark, had keloids radiation tx done age 59  . TOTAL KNEE ARTHROPLASTY Left 08/01/2015   Procedure: TOTAL LEFT KNEE ARTHROPLASTY;  Surgeon: Durene Romans, MD;  Location: [facility];  Service: Orthopedics;  Laterality: Left;  . TUBAL LIGATION   yrs ago  . TYMPANOSTOMY TUBE PLACEMENT  as child    OB History   No obstetric history on file.      Home Medications    Prior to Admission medications   Medication Sig Start Date End Date Taking? Authorizing Provider  phenazopyridine (PYRIDIUM) 200 MG tablet Take 1 tablet (200 mg total) by mouth 3 (three) times daily. 03/08/20  Yes Diannia Hogenson A, NP  sulfamethoxazole-trimethoprim (BACTRIM DS) 800-160 MG tablet Take 1 tablet by mouth 2 (two) times daily for 7 days. 03/08/20 03/15/20 Yes Robley Matassa A, NP  IBUPROFEN PO Take by mouth.    [provider]  loratadine (CLARITIN) 10 MG tablet Take 10 mg by mouth daily as needed for allergies.     [provider]  ferrous sulfate 325 (65 FE) MG tablet Take 1 tablet (325 mg total) by mouth 3 (three) times daily after meals. Patient not taking: Reported on 12/15/2015 08/01/15 03/08/20  Lanney Gins, PA-C    Family History Family History  Problem Relation Age of Onset  . Cancer Father        leukemia  . Cancer Paternal Aunt        breast  . Cancer Maternal Grandfather        unknown to pt / ?skin  Social History Social History   Tobacco Use  . Smoking status: Never Smoker  . Smokeless tobacco: Never Used  Substance Use Topics  . Alcohol use: No    Comment: rare  . Drug use: No     Allergies   Demerol [meperidine]   Review of Systems Review of Systems   Physical Exam Triage Vital Signs ED Triage Vitals  Enc Vitals Group     BP 03/08/20 1637 (!) 153/93     Pulse Rate 03/08/20 1637 71     Resp 03/08/20 1637 16     Temp 03/08/20 1637 98.4 F (36.9 C)     Temp Source 03/08/20 1637 Oral     SpO2 03/08/20 1637 99 %     Weight --      Height --      Head Circumference --      Peak Flow --      Pain Score 03/08/20 1635 8     Pain Loc --      Pain Edu? --      Excl. in GC? --    No data found.  Updated Vital Signs BP (!) 153/93 (BP Location: Right Arm)   Pulse 71   Temp 98.4 F (36.9 C) (Oral)    Resp 16   SpO2 99%   Visual Acuity Right Eye Distance:   Left Eye Distance:   Bilateral Distance:    Right Eye Near:   Left Eye Near:    Bilateral Near:     Physical Exam Vitals and nursing note reviewed.  Constitutional:      General: She is not in acute distress.    Appearance: Normal appearance. She is not ill-appearing, toxic-appearing or diaphoretic.  HENT:     Head: Normocephalic.  Eyes:     Conjunctiva/sclera: Conjunctivae normal.  Pulmonary:     Effort: Pulmonary effort is normal.  Musculoskeletal:        General: Normal range of motion.     Cervical back: Normal range of motion.  Skin:    General: Skin is warm and dry.     Findings: No rash.  Neurological:     Mental Status: She is alert.  Psychiatric:        Mood and Affect: Mood normal.      UC Treatments / Results  Labs (all labs ordered are listed, but only abnormal results are displayed) Labs Reviewed  POCT URINALYSIS DIPSTICK, ED / UC - Abnormal; Notable for the following components:      Result Value   Glucose, UA 500 (*)    Bilirubin Urine LARGE (*)    Ketones, ur 40 (*)    Hgb urine dipstick LARGE (*)    Protein, ur >=300 (*)    Nitrite POSITIVE (*)    Leukocytes,Ua LARGE (*)    All other components within normal limits  URINE CULTURE  URINALYSIS, ROUTINE W REFLEX MICROSCOPIC  CBG MONITORING, ED    EKG   Radiology No results found.  Procedures Procedures (including critical care time)  Medications Ordered in UC Medications - No data to display  Initial Impression / Assessment and Plan / UC Course  I have reviewed the triage vital signs and the nursing notes.  Pertinent labs & imaging results that were available during my care of the patient were reviewed by me and considered in my medical decision making (see chart for details).     Gross hematuria and dysuria urine with large leuks, positive nitrates, protein,  large hemoglobin, ketones, large bilirubin and glucose. The  results may be somewhat skewed based on the amount of blood in the specimen and the point-of-care machine that we use. Based on symptoms and urinalysis we will go ahead and treat for urinary tract infection at this time.  Spoke with patient about possibly having a kidney stone that may have passed based on the previous flank pain and now gross hematuria. Treating symptoms with Bactrim.  Pyridium given for symptoms Recommend Tylenol extra string for the pain.  Push fluids. Follow up as needed for continued or worsening symptoms  Final Clinical Impressions(s) / UC Diagnoses   Final diagnoses:  Gross hematuria  Dysuria     Discharge Instructions     Treating you for urinary tract infection, possible kidney infection You may have passed a kidney stone as we talked about.  Pyridium as needed for discomfort You can also take tylenol extra strength for the pain Drink plenty of water.  Follow up as needed for continued or worsening symptoms       ED Prescriptions    Medication Sig Dispense Auth. Provider   sulfamethoxazole-trimethoprim (BACTRIM DS) 800-160 MG tablet Take 1 tablet by mouth 2 (two) times daily for 7 days. 14 tablet Karissa Meenan A, NP   phenazopyridine (PYRIDIUM) 200 MG tablet Take 1 tablet (200 mg total) by mouth 3 (three) times daily. 6 tablet Dahlia Byes A, NP     PDMP not reviewed this encounter.   Janace Aris, NP 03/08/20 1926

## 2020-03-08 NOTE — ED Triage Notes (Signed)
Pt presents with dysuria and urinary urgency X 1 day. Pt states she has been constantly going to urinate and has noticed blood in her urine. She states she has had a UTI before and believes this is the worst one she has had.

## 2020-03-10 LAB — URINE CULTURE: Culture: NO GROWTH

## 2021-02-15 ENCOUNTER — Other Ambulatory Visit (HOSPITAL_COMMUNITY): Payer: Self-pay | Admitting: Orthopedic Surgery

## 2021-02-15 ENCOUNTER — Other Ambulatory Visit: Payer: Self-pay | Admitting: Orthopedic Surgery

## 2021-02-15 DIAGNOSIS — Z96652 Presence of left artificial knee joint: Secondary | ICD-10-CM

## 2021-02-23 ENCOUNTER — Other Ambulatory Visit: Payer: Self-pay

## 2021-02-23 ENCOUNTER — Encounter (HOSPITAL_COMMUNITY)
Admission: RE | Admit: 2021-02-23 | Discharge: 2021-02-23 | Disposition: A | Discharge status: Home / Self Care | Payer: BC Managed Care – PPO | Source: Ambulatory Visit | Attending: Orthopedic Surgery | Admitting: Orthopedic Surgery

## 2021-02-23 ENCOUNTER — Ambulatory Visit (HOSPITAL_COMMUNITY)
Admission: RE | Admit: 2021-02-23 | Discharge: 2021-02-23 | Disposition: A | Discharge status: Home / Self Care | Payer: BC Managed Care – PPO | Source: Ambulatory Visit | Attending: Orthopedic Surgery | Admitting: Orthopedic Surgery

## 2021-02-23 DIAGNOSIS — Z96652 Presence of left artificial knee joint: Secondary | ICD-10-CM | POA: Diagnosis present

## 2021-02-23 MED ORDER — TECHNETIUM TC 99M MEDRONATE IV KIT
20.0000 | PACK | Freq: Once | INTRAVENOUS | Status: AC | PRN
  Administered 2021-02-23: 20.8 via INTRAVENOUS

## 2021-03-10 LAB — EXTERNAL GENERIC LAB PROCEDURE: COLOGUARD: NEGATIVE

## 2021-04-10 NOTE — Progress Notes (Addendum)
Anesthesia Review: ? ?PCP: Brenda Lynn LOV 02/21/21 with labs on chart Clearance 03/09/21  ?Cardiologist : none  ?Chest x-ray : ?EKG : ?Echo : ?Stress test: ?Cardiac Cath :  ?Activity level: can do a flight of stairs without difficulty  ?Sleep Study/ CPAP : none  ?Fasting Blood Sugar :      / Checks Blood Sugar -- times a day:   ?Blood Thinner/ Instructions /Last Dose: ?ASA / Instructions/ Last Dose :   ?Hgba1c- 5.4 on 02/21/21  ?Covid test on 04/17/21 at 0845am  ?PT with recent uti .  Antibiotic to be completed on 04/12/21 per pt.  Diagnosed by Dr Orvan Seen at [facility].   ?

## 2021-04-10 NOTE — Progress Notes (Signed)
DUE TO COVID-19 ONLY ONE VISITOR IS ALLOWED TO COME WITH YOU AND STAY IN THE WAITING ROOM ONLY DURING PRE OP AND PROCEDURE DAY OF SURGERY.  2 VISITOR  MAY VISIT WITH YOU AFTER SURGERY IN YOUR PRIVATE ROOM DURING VISITING HOURS ONLY! ?YOU MAY HAVE ONE PERSON SPEND THE NITE WITH YOU IN YOUR ROOM AFTER SURGERY.   ? ?YOU NEED TO HAVE A COVID 19 TEST ON     04/17/21                             COME THRU MAIN ENTRANCE AT [facility] HAVE A SEAT IN THE LOBBY ON THE RIGHT AS YOU COME THRU THE DOOR.  CALL [phone] AND GIVE THEM YOUR NAME AND LET THEM KNOW YOU ARE HERE FOR COVID TESTING.  ? ? Your procedure is scheduled on:  ?          04/20/21.   ? Report to [facility] Main  Entrance ? ? Report to admitting at       0515           AM ?DO NOT BRING INSURANCE CARD, PICTURE ID OR WALLET DAY OF SURGERY.  ?  ? ? Call this number if you have problems the morning of surgery [phone]  ? ? REMEMBER: NO  SOLID FOODS , CANDY, GUM OR MINTS AFTER MIDNITE THE NITE BEFORE SURGERY .       Marland Kitchen CLEAR LIQUIDS UNTIL     0430am            DAY OF SURGERY.      PLEASE FINISH ENSURE DRINK PER SURGEON ORDER  WHICH NEEDS TO BE COMPLETED AT 0430am         MORNING OF SURGERY.   ? ? ? ? ?CLEAR LIQUID DIET ? ? ?Foods Allowed      ?WATER ?BLACK COFFEE ( SUGAR OK, NO MILK, CREAM OR CREAMER) REGULAR AND DECAF  ?TEA ( SUGAR OK NO MILK, CREAM, OR CREAMER) REGULAR AND DECAF  ?PLAIN JELLO ( NO RED)  ?FRUIT ICES ( NO RED, NO FRUIT PULP)  ?POPSICLES ( NO RED)  ?JUICE- APPLE, WHITE GRAPE AND WHITE CRANBERRY  ?SPORT DRINK LIKE GATORADE ( NO RED)  ?CLEAR BROTH ( VEGETABLE , CHICKEN OR BEEF)                                                               ? ?    ? ?BRUSH YOUR TEETH MORNING OF SURGERY AND RINSE YOUR MOUTH OUT, NO CHEWING GUM CANDY OR MINTS. ?  ? ? Take these medicines the morning of surgery with A SIP OF WATER:  none  ? ? ?DO NOT TAKE ANY DIABETIC MEDICATIONS DAY OF YOUR SURGERY ?                  ?            You may not have any metal on  your body including hair pins and  ?            piercings  Do not wear jewelry, make-up, lotions, powders or perfumes, deodorant ?            Do not wear nail  polish on your fingernails.   ?           IF YOU ARE A FEMALE AND WANT TO SHAVE UNDER ARMS OR LEGS PRIOR TO SURGERY YOU MUST DO SO AT LEAST 48 HOURS PRIOR TO SURGERY.  ?            Men may shave face and neck. ? ? Do not bring valuables to the hospital. [facility] IS NOT ?            RESPONSIBLE   FOR VALUABLES. ? Contacts, dentures or bridgework may not be worn into surgery. ? Leave suitcase in the car. After surgery it may be brought to your room. ? ?  ? Patients discharged the day of surgery will not be allowed to drive home. IF YOU ARE HAVING SURGERY AND GOING HOME THE SAME DAY, YOU MUST HAVE AN ADULT TO DRIVE YOU HOME AND BE WITH YOU FOR 24 HOURS. YOU MAY GO HOME BY TAXI OR UBER OR ORTHERWISE, BUT AN ADULT MUST ACCOMPANY YOU HOME AND STAY WITH YOU FOR 24 HOURS. ?  ? ?            Please read over the following fact sheets you were given: ?_____________________________________________________________________ ? ?[facility] - Preparing for Surgery ?Before surgery, you can play an important role.  Because skin is not sterile, your skin needs to be as free of germs as possible.  You can reduce the number of germs on your skin by washing with CHG (chlorahexidine gluconate) soap before surgery.  CHG is an antiseptic cleaner which kills germs and bonds with the skin to continue killing germs even after washing. ?Please DO NOT use if you have an allergy to CHG or antibacterial soaps.  If your skin becomes reddened/irritated stop using the CHG and inform your nurse when you arrive at Short Stay. ?Do not shave (including legs and underarms) for at least 48 hours prior to the first CHG shower.  You may shave your face/neck. ?Please follow these instructions carefully: ? 1.  Shower with CHG Soap the night before surgery and the  morning of Surgery. ? 2.  If you  choose to wash your hair, wash your hair first as usual with your  normal  shampoo. ? 3.  After you shampoo, rinse your hair and body thoroughly to remove the  shampoo.                           4.  Use CHG as you would any other liquid soap.  You can apply chg directly  to the skin and wash  ?                     Gently with a scrungie or clean washcloth. ? 5.  Apply the CHG Soap to your body ONLY FROM THE NECK DOWN.   Do not use on face/ open      ?                     Wound or open sores. Avoid contact with eyes, ears mouth and genitals (private parts).  ?                     Engineering geologist,  Genitals (private parts) with your normal soap. ?            6.  Wash thoroughly, paying special attention to the  area where your surgery  will be performed. ? 7.  Thoroughly rinse your body with warm water from the neck down. ? 8.  DO NOT shower/wash with your normal soap after using and rinsing off  the CHG Soap. ?               9.  Pat yourself dry with a clean towel. ?           10.  Wear clean pajamas. ?           11.  Place clean sheets on your bed the night of your first shower and do not  sleep with pets. ?Day of Surgery : ?Do not apply any lotions/deodorants the morning of surgery.  Please wear clean clothes to the hospital/surgery center. ? ?FAILURE TO FOLLOW THESE INSTRUCTIONS MAY RESULT IN THE CANCELLATION OF YOUR SURGERY ?PATIENT SIGNATURE_________________________________ ? ?NURSE SIGNATURE__________________________________ ? ?________________________________________________________________________  ? ? ?           ?

## 2021-04-12 ENCOUNTER — Encounter (HOSPITAL_COMMUNITY)
Admission: RE | Admit: 2021-04-12 | Discharge: 2021-04-12 | Disposition: A | Discharge status: Home / Self Care | Payer: BC Managed Care – PPO | Source: Ambulatory Visit | Attending: Orthopedic Surgery | Admitting: Orthopedic Surgery

## 2021-04-12 ENCOUNTER — Encounter (HOSPITAL_COMMUNITY): Payer: Self-pay

## 2021-04-12 ENCOUNTER — Other Ambulatory Visit: Payer: Self-pay

## 2021-04-12 VITALS — BP 126/66 | HR 65 | Temp 98.2°F | Resp 16 | Ht 70.5 in | Wt 160.0 lb

## 2021-04-12 DIAGNOSIS — Z96652 Presence of left artificial knee joint: Secondary | ICD-10-CM | POA: Insufficient documentation

## 2021-04-12 DIAGNOSIS — Z20822 Contact with and (suspected) exposure to covid-19: Secondary | ICD-10-CM | POA: Insufficient documentation

## 2021-04-12 DIAGNOSIS — Z01812 Encounter for preprocedural laboratory examination: Secondary | ICD-10-CM | POA: Diagnosis not present

## 2021-04-12 DIAGNOSIS — Z01818 Encounter for other preprocedural examination: Secondary | ICD-10-CM

## 2021-04-12 HISTORY — DX: Pneumonia, unspecified organism: J18.9

## 2021-04-12 HISTORY — DX: Other specified postprocedural states: Z98.890

## 2021-04-12 HISTORY — DX: Nausea with vomiting, unspecified: R11.2

## 2021-04-12 LAB — COMPREHENSIVE METABOLIC PANEL
ALT: 19 U/L (ref 0–44)
AST: 27 U/L (ref 15–41)
Albumin: 4.6 g/dL (ref 3.5–5.0)
Alkaline Phosphatase: 49 U/L (ref 38–126)
Anion gap: 9 (ref 5–15)
BUN: 34 mg/dL — ABNORMAL HIGH (ref 8–23)
CO2: 24 mmol/L (ref 22–32)
Calcium: 9.4 mg/dL (ref 8.9–10.3)
Chloride: 105 mmol/L (ref 98–111)
Creatinine, Ser: 1.1 mg/dL — ABNORMAL HIGH (ref 0.44–1.00)
GFR, Estimated: 57 mL/min — ABNORMAL LOW (ref >60)
Glucose, Bld: 93 mg/dL (ref 70–99)
Potassium: 4.4 mmol/L (ref 3.5–5.1)
Sodium: 138 mmol/L (ref 135–145)
Total Bilirubin: 0.6 mg/dL (ref 0.3–1.2)
Total Protein: 7.8 g/dL (ref 6.5–8.1)

## 2021-04-12 LAB — CBC
HCT: 41.1 % (ref 36.0–46.0)
Hemoglobin: 13.4 g/dL (ref 12.0–15.0)
MCH: 32.4 pg (ref 26.0–34.0)
MCHC: 32.6 g/dL (ref 30.0–36.0)
MCV: 99.5 fL (ref 80.0–100.0)
Platelets: 220 10*3/uL (ref 150–400)
RBC: 4.13 MIL/uL (ref 3.87–5.11)
RDW: 13.7 % (ref 11.5–15.5)
WBC: 4.3 10*3/uL (ref 4.0–10.5)
nRBC: 0 % (ref 0.0–0.2)

## 2021-04-12 LAB — SURGICAL PCR SCREEN
MRSA, PCR: NEGATIVE
Staphylococcus aureus: NEGATIVE

## 2021-04-17 ENCOUNTER — Encounter (HOSPITAL_COMMUNITY)
Admission: RE | Admit: 2021-04-17 | Discharge: 2021-04-17 | Disposition: A | Discharge status: Home / Self Care | Payer: BC Managed Care – PPO | Source: Ambulatory Visit | Attending: Orthopedic Surgery | Admitting: Orthopedic Surgery

## 2021-04-17 ENCOUNTER — Other Ambulatory Visit: Payer: Self-pay

## 2021-04-17 DIAGNOSIS — Z01812 Encounter for preprocedural laboratory examination: Secondary | ICD-10-CM

## 2021-04-17 DIAGNOSIS — Z20822 Contact with and (suspected) exposure to covid-19: Secondary | ICD-10-CM | POA: Insufficient documentation

## 2021-04-17 LAB — SARS CORONAVIRUS 2 (TAT 6-24 HRS): SARS Coronavirus 2: NEGATIVE

## 2021-04-19 NOTE — Anesthesia Preprocedure Evaluation (Addendum)
Anesthesia Evaluation  ?Patient identified by MRN, date of birth, ID band ?Patient awake ? ? ? ?Reviewed: ?Allergy & Precautions, NPO status , Patient's Chart, lab work & pertinent test results ? ?History of Anesthesia Complications ?(+) PONV and history of anesthetic complications ? ?Airway ?Mallampati: II ? ?TM Distance: >3 FB ?Neck ROM: Full ? ? ? Dental ?no notable dental hx. ? ?  ?Pulmonary ?neg pulmonary ROS,  ?  ?Pulmonary exam normal ? ? ? ? ? ? ? Cardiovascular ?negative cardio ROS ?Normal cardiovascular exam ? ? ?  ?Neuro/Psych ?negative neurological ROS ? negative psych ROS  ? GI/Hepatic ?negative GI ROS, Neg liver ROS,   ?Endo/Other  ?negative endocrine ROS ? Renal/GU ?negative Renal ROS  ?negative genitourinary ?  ?Musculoskeletal ? ?(+) Arthritis ,  ? Abdominal ?  ?Peds ? Hematology ?negative hematology ROS ?(+)   ?Anesthesia Other Findings ?Day of surgery medications reviewed with patient. ? Reproductive/Obstetrics ?negative OB ROS ? ?  ? ? ? ? ? ? ? ? ? ? ? ? ? ?  ?  ? ? ? ? ? ? ? ?Anesthesia Physical ?Anesthesia Plan ? ?ASA: 2 ? ?Anesthesia Plan: Spinal  ? ?Post-op Pain Management: Tylenol PO (pre-op)* and Regional block*  ? ?Induction:  ? ?PONV Risk Score and Plan: 4 or greater and Treatment may vary due to age or medical condition, Ondansetron, Propofol infusion, Dexamethasone and Midazolam ? ?Airway Management Planned: Natural Airway and Simple Face Mask ? ?Additional Equipment: None ? ?Intra-op Plan:  ? ?Post-operative Plan:  ? ?Informed Consent: I have reviewed the patients History and Physical, chart, labs and discussed the procedure including the risks, benefits and alternatives for the proposed anesthesia with the patient or authorized representative who has indicated his/her understanding and acceptance.  ? ? ? ? ? ?Plan Discussed with: CRNA ? ?Anesthesia Plan Comments:   ? ? ? ? ? ?Anesthesia Quick Evaluation ? ?

## 2021-04-20 ENCOUNTER — Other Ambulatory Visit: Payer: Self-pay

## 2021-04-20 ENCOUNTER — Ambulatory Visit (HOSPITAL_COMMUNITY): Payer: BC Managed Care – PPO | Admitting: Anesthesiology

## 2021-04-20 ENCOUNTER — Encounter (HOSPITAL_COMMUNITY)
Admission: RE | Disposition: A | Discharge status: Home / Self Care | Payer: Self-pay | Source: Ambulatory Visit | Attending: Orthopedic Surgery

## 2021-04-20 ENCOUNTER — Encounter (HOSPITAL_COMMUNITY): Payer: Self-pay | Admitting: Orthopedic Surgery

## 2021-04-20 ENCOUNTER — Inpatient Hospital Stay (HOSPITAL_COMMUNITY)
Admission: RE | Admit: 2021-04-20 | Discharge: 2021-04-21 | DRG: 468 | Disposition: A | Discharge status: Home / Self Care | Payer: BC Managed Care – PPO | Attending: Orthopedic Surgery | Admitting: Orthopedic Surgery

## 2021-04-20 ENCOUNTER — Ambulatory Visit (HOSPITAL_COMMUNITY): Payer: BC Managed Care – PPO | Admitting: Physician Assistant

## 2021-04-20 DIAGNOSIS — Y792 Prosthetic and other implants, materials and accessory orthopedic devices associated with adverse incidents: Secondary | ICD-10-CM | POA: Diagnosis present

## 2021-04-20 DIAGNOSIS — Z96652 Presence of left artificial knee joint: Secondary | ICD-10-CM

## 2021-04-20 DIAGNOSIS — Z20822 Contact with and (suspected) exposure to covid-19: Secondary | ICD-10-CM | POA: Diagnosis present

## 2021-04-20 DIAGNOSIS — T84033A Mechanical loosening of internal left knee prosthetic joint, initial encounter: Principal | ICD-10-CM | POA: Diagnosis present

## 2021-04-20 DIAGNOSIS — Z806 Family history of leukemia: Secondary | ICD-10-CM | POA: Diagnosis not present

## 2021-04-20 DIAGNOSIS — Z9049 Acquired absence of other specified parts of digestive tract: Secondary | ICD-10-CM

## 2021-04-20 HISTORY — PX: TOTAL KNEE REVISION: SHX996

## 2021-04-20 LAB — TYPE AND SCREEN
ABO/RH(D): O POS
Antibody Screen: NEGATIVE

## 2021-04-20 SURGERY — TOTAL KNEE REVISION
Anesthesia: Spinal | Site: Knee | Laterality: Left

## 2021-04-20 MED ORDER — 0.9 % SODIUM CHLORIDE (POUR BTL) OPTIME
TOPICAL | Status: DC | PRN
  Administered 2021-04-20: 1000 mL

## 2021-04-20 MED ORDER — BUPIVACAINE-EPINEPHRINE (PF) 0.5% -1:200000 IJ SOLN
INTRAMUSCULAR | Status: DC | PRN
  Administered 2021-04-20: 15 mL via PERINEURAL

## 2021-04-20 MED ORDER — OXYCODONE HCL 5 MG/5ML PO SOLN: 5.0000 mg | Freq: Once | ORAL | Status: DC | PRN

## 2021-04-20 MED ORDER — KETOROLAC TROMETHAMINE 30 MG/ML IJ SOLN
INTRAMUSCULAR | Status: AC
  Filled 2021-04-20: qty 1

## 2021-04-20 MED ORDER — TRANEXAMIC ACID-NACL 1000-0.7 MG/100ML-% IV SOLN
1000.0000 mg | Freq: Once | INTRAVENOUS | Status: AC
  Administered 2021-04-20: 1000 mg via INTRAVENOUS
  Filled 2021-04-20: qty 100

## 2021-04-20 MED ORDER — ONDANSETRON HCL 4 MG PO TABS: 4.0000 mg | ORAL_TABLET | Freq: Four times a day (QID) | ORAL | Status: DC | PRN

## 2021-04-20 MED ORDER — CHLORHEXIDINE GLUCONATE 0.12 % MT SOLN
15.0000 mL | Freq: Once | OROMUCOSAL | Status: AC
  Administered 2021-04-20: 15 mL via OROMUCOSAL

## 2021-04-20 MED ORDER — FLUTICASONE PROPIONATE 50 MCG/ACT NA SUSP
1.0000 | Freq: Every day | NASAL | Status: DC | PRN
  Filled 2021-04-20: qty 16

## 2021-04-20 MED ORDER — POVIDONE-IODINE 10 % EX SWAB
2.0000 "application " | Freq: Once | CUTANEOUS | Status: AC
  Administered 2021-04-20: 2 via TOPICAL

## 2021-04-20 MED ORDER — BUPIVACAINE-EPINEPHRINE (PF) 0.25% -1:200000 IJ SOLN
INTRAMUSCULAR | Status: AC
  Filled 2021-04-20: qty 30

## 2021-04-20 MED ORDER — PHENOL 1.4 % MT LIQD: 1.0000 | OROMUCOSAL | Status: DC | PRN

## 2021-04-20 MED ORDER — SODIUM CHLORIDE 0.9 % IR SOLN
Status: DC | PRN
  Administered 2021-04-20: 3000 mL

## 2021-04-20 MED ORDER — TOBRAMYCIN SULFATE 1.2 G IJ SOLR
INTRAMUSCULAR | Status: AC
  Filled 2021-04-20: qty 1.2

## 2021-04-20 MED ORDER — DOCUSATE SODIUM 100 MG PO CAPS
100.0000 mg | ORAL_CAPSULE | Freq: Two times a day (BID) | ORAL | Status: DC
  Administered 2021-04-20 – 2021-04-21 (×2): 100 mg via ORAL
  Filled 2021-04-20 (×3): qty 1

## 2021-04-20 MED ORDER — POLYETHYLENE GLYCOL 3350 17 G PO PACK: 17.0000 g | PACK | Freq: Every day | ORAL | Status: DC | PRN

## 2021-04-20 MED ORDER — DIPHENHYDRAMINE HCL 12.5 MG/5ML PO ELIX: 12.5000 mg | ORAL_SOLUTION | ORAL | Status: DC | PRN

## 2021-04-20 MED ORDER — ACETAMINOPHEN 500 MG PO TABS
1000.0000 mg | ORAL_TABLET | Freq: Once | ORAL | Status: AC
Start: 2021-04-20 — End: 2021-04-20
  Administered 2021-04-20: 1000 mg via ORAL
  Filled 2021-04-20: qty 2

## 2021-04-20 MED ORDER — ASPIRIN 81 MG PO CHEW
81.0000 mg | CHEWABLE_TABLET | Freq: Two times a day (BID) | ORAL | Status: DC
  Administered 2021-04-20 – 2021-04-21 (×2): 81 mg via ORAL
  Filled 2021-04-20 (×2): qty 1

## 2021-04-20 MED ORDER — TRANEXAMIC ACID-NACL 1000-0.7 MG/100ML-% IV SOLN
1000.0000 mg | INTRAVENOUS | Status: AC
  Administered 2021-04-20: 1000 mg via INTRAVENOUS
  Filled 2021-04-20: qty 100

## 2021-04-20 MED ORDER — METHOCARBAMOL 500 MG IVPB - SIMPLE MED
500.0000 mg | Freq: Four times a day (QID) | INTRAVENOUS | Status: DC | PRN
  Filled 2021-04-20: qty 50

## 2021-04-20 MED ORDER — METOCLOPRAMIDE HCL 5 MG PO TABS: 5.0000 mg | ORAL_TABLET | Freq: Three times a day (TID) | ORAL | Status: DC | PRN

## 2021-04-20 MED ORDER — FENTANYL CITRATE PF 50 MCG/ML IJ SOSY: 25.0000 ug | PREFILLED_SYRINGE | INTRAMUSCULAR | Status: DC | PRN

## 2021-04-20 MED ORDER — PROPOFOL 10 MG/ML IV BOLUS
INTRAVENOUS | Status: DC | PRN
  Administered 2021-04-20: 30 ug via INTRAVENOUS
  Administered 2021-04-20: 10 ug via INTRAVENOUS

## 2021-04-20 MED ORDER — FERROUS SULFATE 325 (65 FE) MG PO TABS
325.0000 mg | ORAL_TABLET | Freq: Three times a day (TID) | ORAL | Status: DC
  Administered 2021-04-20 – 2021-04-21 (×2): 325 mg via ORAL
  Filled 2021-04-20 (×2): qty 1

## 2021-04-20 MED ORDER — CEFAZOLIN SODIUM-DEXTROSE 2-4 GM/100ML-% IV SOLN
2.0000 g | Freq: Four times a day (QID) | INTRAVENOUS | Status: AC
  Administered 2021-04-20 (×2): 2 g via INTRAVENOUS
  Filled 2021-04-20 (×2): qty 100

## 2021-04-20 MED ORDER — EPHEDRINE 5 MG/ML INJ
INTRAVENOUS | Status: AC
  Filled 2021-04-20: qty 5

## 2021-04-20 MED ORDER — DROPERIDOL 2.5 MG/ML IJ SOLN: 0.6250 mg | Freq: Once | INTRAMUSCULAR | Status: DC | PRN

## 2021-04-20 MED ORDER — PROPOFOL 500 MG/50ML IV EMUL
INTRAVENOUS | Status: DC | PRN
  Administered 2021-04-20: 75 ug/kg/min via INTRAVENOUS

## 2021-04-20 MED ORDER — SODIUM CHLORIDE (PF) 0.9 % IJ SOLN
INTRAMUSCULAR | Status: AC
  Filled 2021-04-20: qty 30

## 2021-04-20 MED ORDER — OXYCODONE HCL 5 MG PO TABS: 5.0000 mg | ORAL_TABLET | Freq: Once | ORAL | Status: DC | PRN

## 2021-04-20 MED ORDER — LORATADINE 10 MG PO TABS: 10.0000 mg | ORAL_TABLET | Freq: Every day | ORAL | Status: DC | PRN

## 2021-04-20 MED ORDER — MENTHOL 3 MG MT LOZG: 1.0000 | LOZENGE | OROMUCOSAL | Status: DC | PRN

## 2021-04-20 MED ORDER — SODIUM CHLORIDE 0.9 % IV SOLN: INTRAVENOUS | Status: DC

## 2021-04-20 MED ORDER — BUPIVACAINE-EPINEPHRINE 0.25% -1:200000 IJ SOLN
INTRAMUSCULAR | Status: DC | PRN
  Administered 2021-04-20: 30 mL

## 2021-04-20 MED ORDER — HYDROMORPHONE HCL 1 MG/ML IJ SOLN: 0.5000 mg | INTRAMUSCULAR | Status: DC | PRN

## 2021-04-20 MED ORDER — LIDOCAINE HCL (PF) 2 % IJ SOLN
INTRAMUSCULAR | Status: AC
  Filled 2021-04-20: qty 5

## 2021-04-20 MED ORDER — METOCLOPRAMIDE HCL 5 MG/ML IJ SOLN: 5.0000 mg | Freq: Three times a day (TID) | INTRAMUSCULAR | Status: DC | PRN

## 2021-04-20 MED ORDER — DEXAMETHASONE SODIUM PHOSPHATE 10 MG/ML IJ SOLN
8.0000 mg | Freq: Once | INTRAMUSCULAR | Status: AC
  Administered 2021-04-20: 8 mg via INTRAVENOUS

## 2021-04-20 MED ORDER — ACETAMINOPHEN 325 MG PO TABS: 325.0000 mg | ORAL_TABLET | Freq: Four times a day (QID) | ORAL | Status: DC | PRN

## 2021-04-20 MED ORDER — BUPIVACAINE IN DEXTROSE 0.75-8.25 % IT SOLN
INTRATHECAL | Status: DC | PRN
  Administered 2021-04-20: 2 mL via INTRATHECAL

## 2021-04-20 MED ORDER — MIDAZOLAM HCL 2 MG/2ML IJ SOLN
1.0000 mg | INTRAMUSCULAR | Status: DC
  Administered 2021-04-20: 1 mg via INTRAVENOUS
  Filled 2021-04-20: qty 2

## 2021-04-20 MED ORDER — BISACODYL 10 MG RE SUPP: 10.0000 mg | Freq: Every day | RECTAL | Status: DC | PRN

## 2021-04-20 MED ORDER — LACTATED RINGERS IV SOLN: INTRAVENOUS | Status: DC

## 2021-04-20 MED ORDER — CLONIDINE HCL (ANALGESIA) 100 MCG/ML EP SOLN
EPIDURAL | Status: DC | PRN
  Administered 2021-04-20: 100 ug

## 2021-04-20 MED ORDER — EPHEDRINE SULFATE-NACL 50-0.9 MG/10ML-% IV SOSY
PREFILLED_SYRINGE | INTRAVENOUS | Status: DC | PRN
  Administered 2021-04-20 (×3): 5 mg via INTRAVENOUS

## 2021-04-20 MED ORDER — KETOROLAC TROMETHAMINE 30 MG/ML IJ SOLN
INTRAMUSCULAR | Status: DC | PRN
  Administered 2021-04-20: 30 mg

## 2021-04-20 MED ORDER — ONDANSETRON HCL 4 MG/2ML IJ SOLN: 4.0000 mg | Freq: Four times a day (QID) | INTRAMUSCULAR | Status: DC | PRN

## 2021-04-20 MED ORDER — DEXAMETHASONE SODIUM PHOSPHATE 10 MG/ML IJ SOLN
10.0000 mg | Freq: Once | INTRAMUSCULAR | Status: AC
  Administered 2021-04-21: 10 mg via INTRAVENOUS
  Filled 2021-04-20: qty 1

## 2021-04-20 MED ORDER — LACTATED RINGERS IV SOLN
INTRAVENOUS | Status: DC
Start: 2021-04-20 — End: 2021-04-20

## 2021-04-20 MED ORDER — OXYCODONE HCL 5 MG PO TABS
5.0000 mg | ORAL_TABLET | ORAL | Status: DC | PRN
  Administered 2021-04-20: 10 mg via ORAL
  Filled 2021-04-20: qty 2

## 2021-04-20 MED ORDER — SODIUM CHLORIDE (PF) 0.9 % IJ SOLN
INTRAMUSCULAR | Status: DC | PRN
Start: 2021-04-20 — End: 2021-04-20
  Administered 2021-04-20: 30 mL

## 2021-04-20 MED ORDER — CEFAZOLIN SODIUM-DEXTROSE 2-4 GM/100ML-% IV SOLN
2.0000 g | INTRAVENOUS | Status: AC
  Administered 2021-04-20: 2 g via INTRAVENOUS
  Filled 2021-04-20: qty 100

## 2021-04-20 MED ORDER — LIDOCAINE HCL (CARDIAC) PF 100 MG/5ML IV SOSY
PREFILLED_SYRINGE | INTRAVENOUS | Status: DC | PRN
  Administered 2021-04-20: 60 mg via INTRATRACHEAL

## 2021-04-20 MED ORDER — METHOCARBAMOL 500 MG PO TABS: 500.0000 mg | ORAL_TABLET | Freq: Four times a day (QID) | ORAL | Status: DC | PRN

## 2021-04-20 MED ORDER — OXYCODONE HCL 5 MG PO TABS
10.0000 mg | ORAL_TABLET | ORAL | Status: DC | PRN
  Administered 2021-04-21: 10 mg via ORAL
  Filled 2021-04-20: qty 2

## 2021-04-20 MED ORDER — VANCOMYCIN HCL 1000 MG IV SOLR
INTRAVENOUS | Status: AC
  Filled 2021-04-20: qty 20

## 2021-04-20 MED ORDER — CELECOXIB 200 MG PO CAPS
200.0000 mg | ORAL_CAPSULE | Freq: Two times a day (BID) | ORAL | Status: DC
  Administered 2021-04-20 – 2021-04-21 (×3): 200 mg via ORAL
  Filled 2021-04-20 (×3): qty 1

## 2021-04-20 MED ORDER — FENTANYL CITRATE PF 50 MCG/ML IJ SOSY
50.0000 ug | PREFILLED_SYRINGE | INTRAMUSCULAR | Status: DC
  Administered 2021-04-20: 50 ug via INTRAVENOUS
  Filled 2021-04-20: qty 2

## 2021-04-20 SURGICAL SUPPLY — 58 items
ATTUNE PSRP INSR SZ5 8 KNEE (Insert) ×1 IMPLANT
BAG COUNTER SPONGE SURGICOUNT (BAG) ×1 IMPLANT
BAG DECANTER FOR FLEXI CONT (MISCELLANEOUS) IMPLANT
BAG ZIPLOCK 12X15 (MISCELLANEOUS) IMPLANT
BLADE SAW SGTL 11.0X1.19X90.0M (BLADE) IMPLANT
BLADE SAW SGTL 13.0X1.19X90.0M (BLADE) ×2 IMPLANT
BLADE SAW SGTL 81X20 HD (BLADE) ×2 IMPLANT
BLADE SURG SZ10 CARB STEEL (BLADE) ×2 IMPLANT
BNDG ELASTIC 6X5.8 VLCR STR LF (GAUZE/BANDAGES/DRESSINGS) ×2 IMPLANT
BRUSH FEMORAL CANAL (MISCELLANEOUS) ×1 IMPLANT
COVER SURGICAL LIGHT HANDLE (MISCELLANEOUS) ×2 IMPLANT
CUFF TOURN SGL QUICK 34 (TOURNIQUET CUFF) ×2
CUFF TRNQT CYL 34X4.125X (TOURNIQUET CUFF) ×1 IMPLANT
DERMABOND ADVANCED (GAUZE/BANDAGES/DRESSINGS) ×1
DERMABOND ADVANCED .7 DNX12 (GAUZE/BANDAGES/DRESSINGS) ×1 IMPLANT
DRAPE INCISE IOBAN 66X45 STRL (DRAPES) ×2 IMPLANT
DRAPE U-SHAPE 47X51 STRL (DRAPES) ×2 IMPLANT
DRESSING AQUACEL AG SP 3.5X10 (GAUZE/BANDAGES/DRESSINGS) IMPLANT
DRSG AQUACEL AG ADV 3.5X10 (GAUZE/BANDAGES/DRESSINGS) ×2 IMPLANT
DRSG AQUACEL AG ADV 3.5X14 (GAUZE/BANDAGES/DRESSINGS) ×1 IMPLANT
DRSG AQUACEL AG SP 3.5X10 (GAUZE/BANDAGES/DRESSINGS)
DURAPREP 26ML APPLICATOR (WOUND CARE) ×4 IMPLANT
ELECT REM PT RETURN 15FT ADLT (MISCELLANEOUS) ×2 IMPLANT
GAUZE SPONGE 2X2 8PLY STRL LF (GAUZE/BANDAGES/DRESSINGS) IMPLANT
GLOVE SURG UNDER POLY LF SZ7.5 (GLOVE) ×4 IMPLANT
GOWN STRL REUS W/ TWL LRG LVL3 (GOWN DISPOSABLE) ×1 IMPLANT
GOWN STRL REUS W/TWL LRG LVL3 (GOWN DISPOSABLE) ×2
HANDPIECE INTERPULSE COAX TIP (DISPOSABLE) ×2
HOLDER FOLEY CATH W/STRAP (MISCELLANEOUS) ×1 IMPLANT
INSERT TIB CMT ATTUNE RP SZ4 (Knees) ×1 IMPLANT
KIT TURNOVER KIT A (KITS) IMPLANT
MANIFOLD NEPTUNE II (INSTRUMENTS) ×2 IMPLANT
NDL SAFETY ECLIPSE 18X1.5 (NEEDLE) IMPLANT
NEEDLE HYPO 18GX1.5 SHARP (NEEDLE) ×2
NS IRRIG 1000ML POUR BTL (IV SOLUTION) ×2 IMPLANT
PACK TOTAL KNEE CUSTOM (KITS) ×2 IMPLANT
PROTECTOR NERVE ULNAR (MISCELLANEOUS) ×2 IMPLANT
SET HNDPC FAN SPRY TIP SCT (DISPOSABLE) ×1 IMPLANT
SET PAD KNEE POSITIONER (MISCELLANEOUS) ×2 IMPLANT
SLEEVE TIB ATTUNE FP 37 (Knees) ×1 IMPLANT
SOLUTION IRRIG SURGIPHOR (IV SOLUTION) IMPLANT
SPIKE FLUID TRANSFER (MISCELLANEOUS) IMPLANT
SPONGE GAUZE 2X2 STER 10/PKG (GAUZE/BANDAGES/DRESSINGS)
STAPLER VISISTAT 35W (STAPLE) IMPLANT
STEM STR ATTUNE PF 16X60 (Knees) ×1 IMPLANT
SUT MNCRL AB 3-0 PS2 18 (SUTURE) ×2 IMPLANT
SUT STRATAFIX PDS+ 0 24IN (SUTURE) ×2 IMPLANT
SUT VIC AB 1 CT1 36 (SUTURE) ×3 IMPLANT
SUT VIC AB 2-0 CT1 27 (SUTURE) ×6
SUT VIC AB 2-0 CT1 TAPERPNT 27 (SUTURE) ×3 IMPLANT
SYR 50ML LL SCALE MARK (SYRINGE) IMPLANT
TOWER CARTRIDGE SMART MIX (DISPOSABLE) ×1 IMPLANT
TRAY FOLEY MTR SLVR 14FR STAT (SET/KITS/TRAYS/PACK) ×1 IMPLANT
TRAY FOLEY MTR SLVR 16FR STAT (SET/KITS/TRAYS/PACK) ×1 IMPLANT
TUBE KAMVAC SUCTION (TUBING) IMPLANT
TUBE SUCTION HIGH CAP CLEAR NV (SUCTIONS) ×2 IMPLANT
WATER STERILE IRR 1000ML POUR (IV SOLUTION) ×3 IMPLANT
WRAP KNEE MAXI GEL POST OP (GAUZE/BANDAGES/DRESSINGS) ×2 IMPLANT

## 2021-04-20 NOTE — Plan of Care (Signed)
?  Problem: Activity: ?Goal: Ability to avoid complications of mobility impairment will improve ?Outcome: Progressing ?  ?Problem: Activity: ?Goal: Ability to avoid complications of mobility impairment will improve ?Outcome: Progressing ?  ?

## 2021-04-20 NOTE — Anesthesia Procedure Notes (Signed)
Procedure Name: [place] ?Date/Time: 04/20/2021 9:00 AM ?Performed by: Lieutenant Diego, CRNA ?Pre-anesthesia Checklist: Patient identified, Emergency Drugs available, Suction available, Patient being monitored and Timeout performed ?Patient Re-evaluated:Patient Re-evaluated prior to induction ?Oxygen Delivery Method: Simple face mask ?Preoxygenation: Pre-oxygenation with 100% oxygen ?Induction Type: IV induction ? ? ? ? ?

## 2021-04-20 NOTE — Anesthesia Procedure Notes (Signed)
Spinal ? ?Patient location during procedure: OR ?Start time: 04/20/2021 8:58 AM ?End time: 04/20/2021 9:02 AM ?Reason for block: surgical anesthesia ?Staffing ?Performed: resident/CRNA  ?Resident/CRNA: Caren Macadam, CRNA ?Preanesthetic Checklist ?Completed: patient identified, IV checked, site marked, risks and benefits discussed, surgical consent, monitors and equipment checked, pre-op evaluation and timeout performed ?Spinal Block ?Patient position: sitting ?Prep: DuraPrep ?Patient monitoring: heart rate, cardiac monitor, continuous pulse ox and blood pressure ?Approach: midline ?Location: L3-4 ?Injection technique: single-shot ?Needle ?Needle type: Pencan  ?Needle gauge: 24 G ?Needle length: 9 cm ?Assessment ?Sensory level: T6 ?Events: CSF return ?Additional Notes ?Pt sitting position. Sterile prep and drape, CSF obtained with first pass spinal needle through introducer, slowly injected 49ml 0.75% bupivacaine, pt tol well. No paresthesia.   ? ? ? ?

## 2021-04-20 NOTE — Plan of Care (Signed)
Problem: Education: ?Goal: Knowledge of the prescribed therapeutic regimen will improve ?Outcome: Progressing ?  ?Problem: Activity: ?Goal: Ability to avoid complications of mobility impairment will improve ?Outcome: Progressing ?  ?Problem: Pain Management: ?Goal: Pain level will decrease with appropriate interventions ?Outcome: Progressing ?  ?Haydee Salter, RN ?04/20/21 ?7:04 PM ? ?

## 2021-04-20 NOTE — Anesthesia Postprocedure Evaluation (Signed)
Anesthesia Post Note ? ?Patient: Mariko Nowakowski ? ?Procedure(s) Performed: TOTAL KNEE REVISION, POSSIBLE ISOLATED TIBIAL REVISION (Left: Knee) ? ?  ? ?Patient location during evaluation: PACU ?Anesthesia Type: Spinal ?Level of consciousness: awake and alert ?Pain management: pain level controlled ?Vital Signs Assessment: post-procedure vital signs reviewed and stable ?Respiratory status: spontaneous breathing, nonlabored ventilation and respiratory function stable ?Cardiovascular status: blood pressure returned to baseline ?Postop Assessment: no apparent nausea or vomiting, spinal receding, no headache and no backache ?Anesthetic complications: no ? ? ?No notable events documented. ? ?Last Vitals:  ?Vitals:  ? 04/20/21 1115 04/20/21 1130  ?BP: 105/63 109/66  ?Pulse: (!) 51 (!) 53  ?Resp: 13 16  ?Temp:  36.4 ?C  ?SpO2: 100% 98%  ?  ?Last Pain:  ?Vitals:  ? 04/20/21 1130  ?TempSrc:   ?PainSc: 0-No pain  ? ? ?  ?  ?  ?  ?  ?  ? ?Shanda Howells ? ? ? ? ?

## 2021-04-20 NOTE — Op Note (Signed)
NAME: Brenda Lynn, KOPEC ?MEDICAL RECORD NO: [id number] ?ACCOUNT NO: [id number] ?DATE OF BIRTH: 1960-03-24 ?FACILITY: [facility] ?LOCATION: [facility] ?PHYSICIAN: Pietro Cassis. Alvan Dame, MD ? ?Operative Report  ? ?DATE OF PROCEDURE: 04/20/2021 ? ? 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 ? 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 ? ?PROCEDURE:  Revision left total knee arthroplasty with revision of the left tibial component and polyethylene. ? ?COMPONENTS USED:  DePuy Attune revision size 4 tibial tray with a 37 mm press-fit sleeve and a 16 x 60 mm press-fit stem with a size 8 mm posterior stabilized insert to match a size 5 femur previously placed. ? ?SURGEON:  Pietro Cassis. Alvan Dame, MD. ? ?ASSISTANT:  Costella Hatcher, PA-C.  Note that [name] was present for the entirety of the case from preoperative positioning, perioperative management of the operative extremity, general facilitation of the case and primary wound closure. ? ?ANESTHESIA:  Regional plus spinal. ? ?ESTIMATED BLOOD LOSS:  Less than 100 mL. ? ?DRAINS:  None. ? ?COMPLICATIONS:  None. ? ?TOURNIQUET TIME:  Up for 40 minutes at 225 mmHg. ? ?INDICATIONS FOR PROCEDURE:  The patient is a pleasant 61 year old female with history of a left total knee arthroplasty performed approximately 5-6 years ago.  She presented to the office over the past year and a half or so with some increasing concerns  ?of knee pain.  Radiographs raised concerns for loosening of the tibial component.  Bone scan was ordered that confirmed that the left tibial component had increased uptake, but the femoral component appeared to be stable.  For that reason, we discussed  ?revision of her tibial component.  We discussed the indications including her pain and dysfunction.  We discussed and reviewed the procedure itself as well as postoperative course and expectations.  Consent was obtained for benefit of pain  relief.   ?Specific risks of further component related issues, infection, DVT, component failure, stiffness, need for future surgeries.  Benefit for improved pain and functionality discussed. ? ?DESCRIPTION OF PROCEDURE:  The patient was brought to the operative theater.  Once adequate anesthesia, preoperative antibiotics, Ancef administered as well as tranexamic acid and Decadron, she was positioned supine with a left thigh tourniquet placed.   ?The left lower extremity was then prepped and draped in sterile fashion.  A timeout was performed identifying the patient, planned procedure, and extremity.  Her old incision was excised.  Soft tissue planes created.  Median arthrotomy was made,  ?encountering clear yellow synovial fluid.  Initially, we performed a complete synovectomy over the medial, lateral and superior aspect of the joint.  This included around the patella and the extensor mechanism.  With this, I was able to flex the knee and ? exposed the proximal tibia and subsequently be able to subluxate the tibia in front of the femoral component.  The tibial component was obviously loose.  I used a thin ACL saw to remove some of the bone anteriorly and removed the tibial component.  I  ?then used an extramedullary guide and made a fresh cut particularly on some bone laterally due to the bone loss medially.  The extramedullary guide was positioned parallel to the tibial spine in the AP and lateral planes.  This cut was made.  All  ?remaining bone was removed off the proximal aspect of tibia including scar tissue.  I then used osteotomes to remove remaining cement.  We then reamed from a 10 mm up to 16  mm reamer.  I then irrigated the canal.  All remaining cement and soft tissue was ? debrided, particularly in the proximal aspect of the femur for a sleeve.  At this point, I used the trials and determined that I would use a size 4 tibial baseplate.  I then broached from a starting broach to a 67, which sat proud as  expected based on  ?the bone loss as well as the removed bone laterally.  I then placed a trial reduction off of the sleeve and did a trial reduction.  With this I determined I am going to use a size 7 or 8 mm insert.  For this reason, we removed all the trial components.   ?We irrigated the knee with normal saline solution pulse lavage.  I injected the synovial capsular junction of the knee with 0.25% Marcaine with epinephrine, 1 mL of Toradol and saline.  The final components were opened and configured on the back table.   ?The final components were then impacted with correct orientation in relation to the tibial tubercle.  Once the final components were impacted, I ended up selecting the 8 mm insert to maximize the balance between extension and flexion.  The patella  ?tracked through the trochlea without application of pressure.  The final 8 mm insert was opened.  It was placed in the tibial tray and the knee was reduced.  We reirrigated the knee with normal saline solution.  The tourniquet was let down after 40  ?minutes.  There was no significant hemostasis required.  The extensor mechanism was then reapproximated using #1 Vicryl and #1 Stratafix suture.  The remainder of the wound was closed with 2-0 Vicryl and a running Monocryl stitch.  The knee was clean,  ?dry and dressed sterilely using surgical glue and Aquacel dressing.  ? ?Postoperatively, we will have her be partial weightbearing on this left lower extremity to allow for adequate bone ingrowth and healing.  We will see her back in the office in 2 weeks.  We will plan on her being admitted in the hospital at least  ?overnight for pain control and antibiotics.  Findings reviewed with her husband.  ? ? ? ?[name] ?D: 04/20/2021 10:29:10 am T: 04/20/2021 11:01:00 am  ?JOB: [id number]/ [id number]  ?

## 2021-04-20 NOTE — Interval H&P Note (Signed)
History and Physical Interval Note: ? ?04/20/2021 ?7:21 AM ? ?Brenda Lynn  has presented today for surgery, with the diagnosis of Failed left total knee arthroplasty.  The various methods of treatment have been discussed with the patient and family. After consideration of risks, benefits and other options for treatment, the patient has consented to  Procedure(s): ?TOTAL KNEE REVISION, POSSIBLE ISOLATED TIBIAL REVISION (Left) as a surgical intervention.  The patient's history has been reviewed, patient examined, no change in status, stable for surgery.  I have reviewed the patient's chart and labs.  Questions were answered to the patient's satisfaction.   ? ? ?Shelda Pal ? ? ?

## 2021-04-20 NOTE — Brief Op Note (Signed)
04/20/2021 ? ?10:18 AM ? ?PATIENT:  Brenda Lynn  61 y.o. female ? ?PRE-OPERATIVE DIAGNOSIS:  Failed left total knee arthroplasty, loose tibial component ? ?POST-OPERATIVE DIAGNOSIS:  Failed left total knee arthroplasty, loose tibial component ? ?PROCEDURE:  Procedure(s): ?TOTAL KNEE REVISION, POSSIBLE ISOLATED TIBIAL REVISION (Left) ? ?SURGEON:  Surgeon(s) and Role: ?   Durene Romans, MD - Primary ? ?PHYSICIAN ASSISTANT: Rosalene Billings, PA-C ? ?ANESTHESIA:   regional and spinal ? ?EBL:  < 100 cc  ? ?BLOOD ADMINISTERED:none ? ?DRAINS: none  ? ?LOCAL MEDICATIONS USED:  MARCAINE    ? ?SPECIMEN:  No Specimen ? ?DISPOSITION OF SPECIMEN:  N/A ? ?COUNTS:  YES ? ?TOURNIQUET:   ?Total Tourniquet Time Documented: ?Thigh (Left) - 40 minutes ?Total: Thigh (Left) - 40 minutes ? ? ?DICTATION: .Other Dictation: Dictation Number [id number] ? ?PLAN OF CARE: Admit to inpatient  ? ?PATIENT DISPOSITION:  PACU - hemodynamically stable. ?  ?Delay start of Pharmacological VTE agent (>24hrs) due to surgical blood loss or risk of bleeding: no ? ?

## 2021-04-20 NOTE — H&P (Signed)
TOTAL KNEE REVISION ADMISSION H&P ? ?Patient is being admitted for left revision total knee arthroplasty. ? ?Subjective: ? ?Chief Complaint:left knee pain, loose tibial component ? ?HPI: Brenda Lynn, 61 y.o. female, has a history of left primary total knee arthroplasty in 2017. She was seen in the office in June 2022 and had been doing well until she noted severe pain one day without injury. She eventually had a bone scan in 2023 which revealed loosening of the tibial component. There was no evidence of infection on exam or lab studies. Dr. Charlann Boxer discussed with her tibial revision, which she is in agreement with.  ? ?Patient Active Problem List  ? Diagnosis Date Noted  ? S/P left TKA 08/01/2015  ? S/P knee replacement 08/01/2015  ? ?Past Medical History:  ?Diagnosis Date  ? Arthritis   ? osteoarthritis- hands, knees  ? 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 minimal  ? Hernia   ? inguinal hernia at present-asymptomatic.  ? Hx of seasonal allergies   ? Pneumonia   ? hx of years ago  ? PONV (postoperative nausea and vomiting)   ? Varicose veins   ? right leg greater than left- wears compression hose  ?  ?Past Surgical History:  ?Procedure Laterality Date  ? APPENDECTOMY  age 57  ? open  ? ENDOVENOUS ABLATION SAPHENOUS VEIN W/ LASER Right 12/08/2015  ? EVLA right greater saphenous vein and stab phlebectomy 10-20 incisions right leg by Gretta Began MD  ? INGUINAL HERNIA REPAIR Left 05/14/2012  ? Procedure: HERNIA REPAIR INGUINAL ADULT;  Surgeon: Lodema Pilot, DO;  Location: [facility];  Service: General;  Laterality: Left;  ? INSERTION OF MESH Left 05/14/2012  ? Procedure: INSERTION OF MESH;  Surgeon: Lodema Pilot, DO;  Location: [facility];  Service: General;  Laterality: Left;  ? neck plastic surgery    ? to remove birthmark, had keloids radiation tx done age 21  ? TOTAL KNEE ARTHROPLASTY Left 08/01/2015  ? Procedure: TOTAL LEFT KNEE ARTHROPLASTY;  Surgeon: Durene Romans, MD;  Location: [facility];  Service: Orthopedics;  Laterality: Left;  ?  TUBAL LIGATION  yrs ago  ? TYMPANOSTOMY TUBE PLACEMENT  as child  ?  ?Current Facility-Administered Medications  ?Medication Dose Route Frequency Provider Last Rate Last Admin  ? ceFAZolin (ANCEF) IVPB 2g/100 mL premix  2 g Intravenous On Call to OR Cassandria Anger, PA-C      ? dexamethasone (DECADRON) injection 8 mg  8 mg Intravenous Once Cassandria Anger, PA-C      ? fentaNYL (SUBLIMAZE) injection 50-100 mcg  50-100 mcg Intravenous UD Kaylyn Layer, MD      ? lactated ringers infusion   Intravenous Continuous Cassandria Anger, PA-C 75 mL/hr at 04/20/21 9937 Continued from Pre-op at 04/20/21 0644  ? lactated ringers infusion   Intravenous Continuous Durene Romans, MD      ? midazolam (VERSED) injection 1-2 mg  1-2 mg Intravenous UD Kaylyn Layer, MD      ? tranexamic acid (CYKLOKAPRON) IVPB 1,000 mg  1,000 mg Intravenous To OR Cassandria Anger, PA-C      ? ?Allergies  ?Allergen Reactions  ? Demerol [Meperidine] Nausea And Vomiting  ?  ?Social History  ? ?Tobacco Use  ? Smoking status: Never  ? Smokeless tobacco: Never  ?Substance Use Topics  ? Alcohol use: No  ?  Comment: rare  ?  ?Family History  ?Problem Relation Age of Onset  ? Cancer Father   ?  leukemia  ? Cancer Paternal Aunt   ?     breast  ? Cancer Maternal Grandfather   ?     unknown to pt / ?skin  ?  ? ? Review of Systems  ?Constitutional:  Negative for chills and fever.  ?Respiratory:  Negative for cough and shortness of breath.   ?Cardiovascular:  Negative for chest pain.  ?Gastrointestinal:  Negative for nausea and vomiting.  ?Musculoskeletal:  Positive for arthralgias.   ? ? ?Objective: ? ?Physical Exam ?Well nourished and well developed. ?General: Alert and oriented x3, cooperative and pleasant, no acute distress. ?Head: normocephalic, atraumatic, neck supple. ?Eyes: EOMI. ? ?Musculoskeletal: ?Left knee exam: ?Her surgical incision is healed without warmth erythema ?No palpable effusion ?Tenderness over the medial aspect of her joint  more so than laterally ?Stable ligaments ?No lower extremity edema or erythema ? ?Calves soft and nontender. Motor function intact in LE. Strength 5/5 LE bilaterally. ?Neuro: Distal pulses 2+. Sensation to light touch intact in LE. ? ?Vital signs in last 24 hours: ?Temp:  [98.4 ?F (36.9 ?C)] 98.4 ?F (36.9 ?C) (03/16 6226) ?Pulse Rate:  [64] 64 (03/16 3335) ?Resp:  [16] 16 (03/16 4562) ?BP: (132)/(81) 132/81 (03/16 5638) ?SpO2:  [100 %] 100 % (03/16 9373) ?Weight:  [72.6 kg] 72.6 kg (03/16 0639) ? ?Labs: ? ?Estimated body mass index is 22.64 kg/m? as calculated from the following: ?  Height as of this encounter: 5' 10.5" (1.791 m). ?  Weight as of this encounter: 72.6 kg. ? ?Imaging Review ?Imaging: ?In office, I reviewed plain radiographs of her left knee from June 2022. At that point there was noted to be some evidence of lucency underneath the medial aspect of her tibial component and evidence of change compared to prior films. ? ?Bone scan was performed on 02/23/2021. Findings revealed increased uptake along the medial greater than lateral aspect of the tibial plateau consistent with findings of loosening versus infection. ? ? ? ?Assessment/Plan: ? ? left knee(s) with failed previous arthroplasty.  ? ?The patient history, physical examination, clinical judgment of the provider and imaging studies are consistent with loosening of the left knee(s), previous total knee arthroplasty. Revision total knee arthroplasty is deemed medically necessary. The treatment options including medical management, injection therapy, arthroscopy and revision arthroplasty were discussed at length. The risks and benefits of revision total knee arthroplasty were presented and reviewed. The risks due to aseptic loosening, infection, stiffness, patella tracking problems, thromboembolic complications and other imponderables were discussed. The patient acknowledged the explanation, agreed to proceed with the plan and consent was signed.  Patient is being admitted for inpatient treatment for surgery, pain control, PT, OT, prophylactic antibiotics, VTE prophylaxis, progressive ambulation and ADL's and discharge planning.The patient is planning to be discharged  home. ? ?Therapy Plans: outpatient therapy at [facility] ?Disposition: Home with husband ?Planned DVT Prophylaxis: aspirin 81mg  BID ?DME needed: none ?PCP: Dr. , clearance received ?TXA: IV ?Allergies: NKDA ?Anesthesia Concerns: none ?BMI: 22.5 ?Last HgbA1c: Not diabetic ? ? ?Other: ?- Oxycodone, robaxin, tylenol, celebrex ? ?Dorinda Hill, PA-C ?Orthopedic Surgery ?[facility] [place] ?[phone] ? ?

## 2021-04-20 NOTE — Anesthesia Procedure Notes (Signed)
Anesthesia Regional Block: Adductor canal block  ? ?Pre-Anesthetic Checklist: , timeout performed,  Correct Patient, Correct Site, Correct Laterality,  Correct Procedure, Correct Position, site marked,  Risks and benefits discussed,  Pre-op evaluation,  At surgeon's request and post-op pain management ? ?Laterality: Left ? ?Prep: Maximum Sterile Barrier Precautions used, chloraprep     ?  ?Needles:  ?Injection technique: Single-shot ? ?Needle Type: Echogenic Stimulator Needle   ? ? ?Needle Length: 9cm  ?Needle Gauge: 22  ? ? ? ?Additional Needles: ? ? ?Procedures:,,,, ultrasound used (permanent image in chart),,    ?Narrative:  ?Start time: 04/20/2021 8:20 AM ?End time: 04/20/2021 8:23 AM ?Injection made incrementally with aspirations every 5 mL. ? ?Performed by: Personally  ?Anesthesiologist: Brennan Bailey, MD ? ?Additional Notes: ?Risks, benefits, and alternative discussed. Patient gave consent for procedure. Patient prepped and draped in sterile fashion. Sedation administered, patient remains easily responsive to voice. Relevant anatomy identified with ultrasound guidance. Local anesthetic given in 5cc increments with no signs or symptoms of intravascular injection. No pain or paraesthesias with injection. Patient monitored throughout procedure with signs of LAST or immediate complications. Tolerated well. Ultrasound image placed in chart.  ?Tawny Asal, MD ? ? ? ? ? ? ?

## 2021-04-20 NOTE — Transfer of Care (Signed)
Immediate Anesthesia Transfer of Care Note ? ?Patient: Brenda Lynn ? ?Procedure(s) Performed: TOTAL KNEE REVISION, POSSIBLE ISOLATED TIBIAL REVISION (Left: Knee) ? ?Patient Location: PACU ? ?Anesthesia Type:Spinal ? ?Level of Consciousness: awake, alert  and oriented ? ?Airway & Oxygen Therapy: Patient Spontanous Breathing and Patient connected to face mask oxygen ? ?Post-op Assessment: Report given to RN and Post -op Vital signs reviewed and stable ? ?Post vital signs: Reviewed and stable ? ?Last Vitals:  ?Vitals Value Taken Time  ?BP    ?Temp    ?Pulse 53 04/20/21 1051  ?Resp 14 04/20/21 1051  ?SpO2 100 % 04/20/21 1051  ?Vitals shown include unvalidated device data. ? ?Last Pain:  ?Vitals:  ? 04/20/21 0827  ?TempSrc:   ?PainSc: 0-No pain  ?   ? ?  ? ?Complications: No notable events documented. ?

## 2021-04-20 NOTE — Discharge Instructions (Addendum)
INSTRUCTIONS AFTER JOINT REPLACEMENT  ? ?Remove items at home which could result in a fall. This includes throw rugs or furniture in walking pathways ?ICE to the affected joint every three hours while awake for 30 minutes at a time, for at least the first 3-5 days, and then as needed for pain and swelling.  Continue to use ice for pain and swelling. You may notice swelling that will progress down to the foot and ankle.  This is normal after surgery.  Elevate your leg when you are not up walking on it.   ?Continue to use the breathing machine you got in the hospital (incentive spirometer) which will help keep your temperature down.  It is common for your temperature to cycle up and down following surgery, especially at night when you are not up moving around and exerting yourself.  The breathing machine keeps your lungs expanded and your temperature down. ? ? ?DIET:  As you were doing prior to hospitalization, we recommend a well-balanced diet. ? ?DRESSING / WOUND CARE / SHOWERING ? ?Keep the surgical dressing until follow up.  The dressing is water proof, so you can shower without any extra covering.  IF THE DRESSING FALLS OFF or the wound gets wet inside, change the dressing with sterile gauze.  Please use good hand washing techniques before changing the dressing.  Do not use any lotions or creams on the incision until instructed by your surgeon.   ? ?ACTIVITY ? ?Increase activity slowly as tolerated, but follow the weight bearing instructions below.   ?No driving for 6 weeks or until further direction given by your physician.  You cannot drive while taking narcotics.  ?No lifting or carrying greater than 10 lbs. until further directed by your surgeon. ?Avoid periods of inactivity such as sitting longer than an hour when not asleep. This helps prevent blood clots.  ?You may return to work once you are authorized by your doctor.  ? ? ? ?WEIGHT BEARING  ? ?Partial weight bearing with assist device as directed.     ? ? ?EXERCISES ? ?Results after joint replacement surgery are often greatly improved when you follow the exercise, range of motion and muscle strengthening exercises prescribed by your doctor. Safety measures are also important to protect the joint from further injury. Any time any of these exercises cause you to have increased pain or swelling, decrease what you are doing until you are comfortable again and then slowly increase them. If you have problems or questions, call your caregiver or physical therapist for advice.  ? ?Rehabilitation is important following a joint replacement. After just a few days of immobilization, the muscles of the leg can become weakened and shrink (atrophy).  These exercises are designed to build up the tone and strength of the thigh and leg muscles and to improve motion. Often times heat used for twenty to thirty minutes before working out will loosen up your tissues and help with improving the range of motion but do not use heat for the first two weeks following surgery (sometimes heat can increase post-operative swelling).  ? ?These exercises can be done on a training (exercise) mat, on the floor, on a table or on a bed. Use whatever works the best and is most comfortable for you.    Use music or television while you are exercising so that the exercises are a pleasant break in your day. This will make your life better with the exercises acting as a break in your routine   that you can look forward to.   Perform all exercises about fifteen times, three times per day or as directed.  You should exercise both the operative leg and the other leg as well. ? ?Exercises include: ?  ?Quad Sets - Tighten up the muscle on the front of the thigh (Quad) and hold for 5-10 seconds.   ?Straight Leg Raises - With your knee straight (if you were given a brace, keep it on), lift the leg to 60 degrees, hold for 3 seconds, and slowly lower the leg.  Perform this exercise against resistance later as your  leg gets stronger.  ?Leg Slides: Lying on your back, slowly slide your foot toward your buttocks, bending your knee up off the floor (only go as far as is comfortable). Then slowly slide your foot back down until your leg is flat on the floor again.  ?Angel Wings: Lying on your back spread your legs to the side as far apart as you can without causing discomfort.  ?Hamstring Strength:  Lying on your back, push your heel against the floor with your leg straight by tightening up the muscles of your buttocks.  Repeat, but this time bend your knee to a comfortable angle, and push your heel against the floor.  You may put a pillow under the heel to make it more comfortable if necessary.  ? ?A rehabilitation program following joint replacement surgery can speed recovery and prevent re-injury in the future due to weakened muscles. Contact your doctor or a physical therapist for more information on knee rehabilitation.  ? ? ?CONSTIPATION ? ?Constipation is defined medically as fewer than three stools per week and severe constipation as less than one stool per week.  Even if you have a regular bowel pattern at home, your normal regimen is likely to be disrupted due to multiple reasons following surgery.  Combination of anesthesia, postoperative narcotics, change in appetite and fluid intake all can affect your bowels.  ? ?YOU MUST use at least one of the following options; they are listed in order of increasing strength to get the job done.  They are all available over the counter, and you may need to use some, POSSIBLY even all of these options:   ? ?Drink plenty of fluids (prune juice may be helpful) and high fiber foods ?Colace 100 mg by mouth twice a day  ?Senokot for constipation as directed and as needed Dulcolax (bisacodyl), take with full glass of water  ?Miralax (polyethylene glycol) once or twice a day as needed. ? ?If you have tried all these things and are unable to have a bowel movement in the first 3-4 days  after surgery call either your surgeon or your primary doctor.   ? ?If you experience loose stools or diarrhea, hold the medications until you stool forms back up.  If your symptoms do not get better within 1 week or if they get worse, check with your doctor.  If you experience "the worst abdominal pain ever" or develop nausea or vomiting, please contact the office immediately for further recommendations for treatment. ? ? ?ITCHING:  If you experience itching with your medications, try taking only a single pain pill, or even half a pain pill at a time.  You can also use Benadryl over the counter for itching or also to help with sleep.  ? ?TED HOSE STOCKINGS:  Use stockings on both legs until for at least 2 weeks or as directed by physician office. They may be removed   at night for sleeping. ? ?MEDICATIONS:  See your medication summary on the ?After Visit Summary? that nursing will review with you.  You may have some home medications which will be placed on hold until you complete the course of blood thinner medication.  It is important for you to complete the blood thinner medication as prescribed. ? ?PRECAUTIONS:  If you experience chest pain or shortness of breath - call 911 immediately for transfer to the hospital emergency department.  ? ?If you develop a fever greater that 101 F, purulent drainage from wound, increased redness or drainage from wound, foul odor from the wound/dressing, or calf pain - CONTACT YOUR SURGEON.   ?                                                ?FOLLOW-UP APPOINTMENTS:  If you do not already have a post-op appointment, please call the office for an appointment to be seen by your surgeon.  Guidelines for how soon to be seen are listed in your ?After Visit Summary?, but are typically between 1-4 weeks after surgery. ? ?OTHER INSTRUCTIONS:  ? ?Knee Replacement:  Do not place pillow under knee, focus on keeping the knee straight while resting. CPM instructions: 0-90 degrees, 2 hours in the  morning, 2 hours in the afternoon, and 2 hours in the evening. Place foam block, curve side up under heel at all times except when in CPM or when walking.  DO NOT modify, tear, cut, or change the foam block

## 2021-04-20 NOTE — Progress Notes (Signed)
Physical Therapy Evaluation ?Patient Details ?Name: Brenda HerbKathryn Battin ?MRN: [id number] ?DOB: [birth year] ?Today's Date: 04/20/2021 ? ?History of Present Illness ? Pt is a 61 yo female presenting s/p TKR on 04/20/21 for tibial component and polyethylene liner. PMH: OA, hearing loss (L ear), primary Lt TKA in 2017. ?  ?Clinical Impression ? Pt is s/p the surgery above resulting in the deficits listed below (see PT Problem List). Pt min assist for bed mobility, min guard for transfers and ambulation. Pt is PWB at 50% and is able to maintain WB status with minimal verbal cuing. Provided HEP for increased ROM and circulation and pt completed exercises with tactile and verbal cuing. Pt is very pleasant and motivated to participate in therapy and feel they will progress well. Pt will benefit from skilled PT to increase their independence and safety with mobility to allow discharge to the venue listed below.  ?   ?   ? ?Recommendations for follow up therapy are one component of a multi-disciplinary discharge planning process, led by the attending physician.  Recommendations may be updated based on patient status, additional functional criteria and insurance authorization. ? ?Follow Up Recommendations Follow physician's recommendations for discharge plan and follow up therapies ? ?  ?Assistance Recommended at Discharge Set up Supervision/Assistance  ?Patient can return home with the following ? A little help with walking and/or transfers;A little help with bathing/dressing/bathroom;Assistance with cooking/housework;Assist for transportation;Help with stairs or ramp for entrance ? ?  ?Equipment Recommendations None recommended by PT (Pt has required DME)  ?Recommendations for Other Services ?    ?  ?Functional Status Assessment Patient has had a recent decline in their functional status and demonstrates the ability to make significant improvements in function in a reasonable and predictable amount of time.  ? ?  ?Precautions /  Restrictions Precautions ?Precautions: Knee ?Restrictions ?Weight Bearing Restrictions: Yes ?LLE Weight Bearing: Partial weight bearing ?LLE Partial Weight Bearing Percentage or Pounds: 50  ? ?  ? ?Mobility ? Bed Mobility ?Overal bed mobility: Needs Assistance ?Bed Mobility: Sit to Supine ?  ?  ?  ?Sit to supine: Min assist, HOB elevated ?  ?General bed mobility comments: Pt min assist for steadying only with HOB elevated, VC for sequencing. Upon sitting, pt reported some dizziness that was well-tolerated after ~60s rest. Monitored throughout rest of session without change. BP sitting EOB 122/78 HR 57. Pt reports lower HR is baseline. ?  ? ?Transfers ?Overall transfer level: Needs assistance ?Equipment used: Rolling walker (2 wheels) ?Transfers: Sit to/from Stand ?Sit to Stand: Min guard ?  ?  ?  ?  ?  ?General transfer comment: Pt required min guard for safety only, no physical assist required. Minimal VC for hand placement and sequencing, especially of LLE to observe PWB precautions. Pt educated on PWB at 50% and was able to appropriately weight shift and bear weight through BUEs to offload LLE. ?  ? ?Ambulation/Gait ?Ambulation/Gait assistance: Min guard, +2 safety/equipment ?Gait Distance (Feet): 50 Feet ?Assistive device: Rolling walker (2 wheels) ?Gait Pattern/deviations: Step-to pattern, Decreased stance time - left, Decreased stride length, Decreased step length - right, Decreased step length - left, Decreased weight shift to left ?Gait velocity: decreased ?  ?  ?General Gait Details: Pt required min guard with no physical assist required, +2 for recliner follow for safety. Pt ambulated with step to pattern, weight shifted to RLE and BUEs, decreased step length on L and managed RW well. No overt LOB. Pt was able to maintain PWB  on LLE for majority of time with minimal VCs. Several standing rest breaks. VCs to continue breathing throughout ambulation. ? ?Stairs ?  ?  ?  ?  ?  ? ?Wheelchair Mobility ?   ? ?Modified Rankin (Stroke Patients Only) ?  ? ?  ? ?Balance Overall balance assessment: Needs assistance ?Sitting-balance support: Feet unsupported, No upper extremity supported ?Sitting balance-Leahy Scale: Fair ?  ?  ?Standing balance support: Bilateral upper extremity supported, During functional activity, Reliant on assistive device for balance ?Standing balance-Leahy Scale: Good ?Standing balance comment: Pt reliant on BUE support during static stance and functional activity secondary to PWB precuations on LLE. ?  ?  ?  ?  ?  ?  ?  ?  ?  ?  ?  ?   ? ? ? ?Pertinent Vitals/Pain Pain Assessment ?Pain Assessment: 0-10 ?Pain Score: 2  ?Pain Location: left knee ?Pain Descriptors / Indicators: Guarding, Discomfort ?Pain Intervention(s): Limited activity within patient's tolerance, Repositioned, Monitored during session, Ice applied  ? ? ?Home Living Family/patient expects to be discharged to:: Private residence ?Living Arrangements: Spouse/significant other (Husband [name]) ?Available Help at Discharge: Available 24 hours/day;Family ?Type of Home: House ?Home Access: Level entry ?  ?  ?Alternate Level Stairs-Number of Steps: 14 ?Home Layout: Two level;Able to live on main level with bedroom/bathroom (Can stay in guest bedroom) ?Home Equipment: Cane - single Librarian, academic (2 wheels) ?   ?  ?Prior Function Prior Level of Function : Independent/Modified Independent;Working/employed ?  ?  ?  ?  ?  ?  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  ? ? ?Hand Dominance  ?   ? ?  ?Extremity/Trunk Assessment  ? Upper Extremity Assessment ?Upper Extremity Assessment: Overall WFL for tasks assessed ?  ? ?Lower Extremity Assessment ?Lower Extremity Assessment: RLE deficits/detail;LLE deficits/detail ?RLE Deficits / Details: MMT: Ank PF/DF 5/5 ?RLE Sensation: WNL ?LLE Deficits / Details: MMT: Ank PF/DF 5/5, no extensor lag noted. ?LLE Sensation: WNL ?  ? ?Cervical / Trunk Assessment ?Cervical / Trunk  Assessment: Normal  ?Communication  ? Communication: No difficulties  ?Cognition Arousal/Alertness: Awake/alert ?Behavior During Therapy: [facility] for tasks assessed/performed ?Overall Cognitive Status: Within Functional Limits for tasks assessed ?  ?  ?  ?  ?  ?  ?  ?  ?  ?  ?  ?  ?  ?  ?  ?  ?  ?  ?  ? ?  ?General Comments General comments (skin integrity, edema, etc.): Husband Jonny Ruiz present for session ? ?  ?Exercises Total Joint Exercises ?Ankle Circles/Pumps: AROM, Both, 15 reps ?Quad Sets: AROM, Left, 5 reps (Pt required tactile cuing and VCs to breath through exercise)  ? ?Assessment/Plan  ?  ?PT Assessment Patient needs continued PT services  ?PT Problem List Decreased strength;Decreased range of motion;Decreased activity tolerance;Decreased balance;Decreased mobility;Decreased coordination;Decreased knowledge of use of DME;Decreased safety awareness;Decreased knowledge of precautions;Pain ? ?   ?  ?PT Treatment Interventions DME instruction;Gait training;Stair training;Functional mobility training;Therapeutic exercise;Therapeutic activities;Balance training;Neuromuscular re-education;Patient/family education   ? ?PT Goals (Current goals can be found in the Care Plan section)  ?Acute Rehab PT Goals ?Patient Stated Goal: To get better and go home ?PT Goal Formulation: With patient/family ?Time For Goal Achievement: 04/27/21 ?Potential to Achieve Goals: Good ? ?  ?Frequency 7X/week ?  ? ? ?Co-evaluation   ?  ?  ?  ?  ? ? ?  ?AM-PAC PT "6 Clicks" Mobility  ?Outcome Measure Help needed  turning from your back to your side while in a flat bed without using bedrails?: A Little ?Help needed moving from lying on your back to sitting on the side of a flat bed without using bedrails?: A Little ?Help needed moving to and from a bed to a chair (including a wheelchair)?: A Little ?Help needed standing up from a chair using your arms (e.g., wheelchair or bedside chair)?: A Little ?Help needed to walk in hospital room?: A  Little ?Help needed climbing 3-5 steps with a railing? : A Little ?6 Click Score: 18 ? ?  ?End of Session Equipment Utilized During Treatment: Gait belt ?Activity Tolerance: Patient tolerated treatment well ?Patient left: in

## 2021-04-20 NOTE — Progress Notes (Signed)
Assisted Dr. Howze with left, ultrasound guided, adductor canal block. Side rails up, monitors on throughout procedure. See vital signs in flow sheet. Tolerated Procedure well.  

## 2021-04-21 ENCOUNTER — Encounter (HOSPITAL_COMMUNITY): Payer: Self-pay | Admitting: Orthopedic Surgery

## 2021-04-21 LAB — BASIC METABOLIC PANEL
Anion gap: 7 (ref 5–15)
BUN: 18 mg/dL (ref 8–23)
CO2: 26 mmol/L (ref 22–32)
Calcium: 8.4 mg/dL — ABNORMAL LOW (ref 8.9–10.3)
Chloride: 107 mmol/L (ref 98–111)
Creatinine, Ser: 0.93 mg/dL (ref 0.44–1.00)
GFR, Estimated: >60 mL/min (ref >60)
Glucose, Bld: 134 mg/dL — ABNORMAL HIGH (ref 70–99)
Potassium: 4.4 mmol/L (ref 3.5–5.1)
Sodium: 140 mmol/L (ref 135–145)

## 2021-04-21 LAB — CBC
HCT: 30.7 % — ABNORMAL LOW (ref 36.0–46.0)
Hemoglobin: 10 g/dL — ABNORMAL LOW (ref 12.0–15.0)
MCH: 32.6 pg (ref 26.0–34.0)
MCHC: 32.6 g/dL (ref 30.0–36.0)
MCV: 100 fL (ref 80.0–100.0)
Platelets: 164 10*3/uL (ref 150–400)
RBC: 3.07 MIL/uL — ABNORMAL LOW (ref 3.87–5.11)
RDW: 13.7 % (ref 11.5–15.5)
WBC: 7.6 10*3/uL (ref 4.0–10.5)
nRBC: 0 % (ref 0.0–0.2)

## 2021-04-21 MED ORDER — METHOCARBAMOL 500 MG PO TABS
500.0000 mg | ORAL_TABLET | Freq: Four times a day (QID) | ORAL | 0 refills | Status: DC | PRN
Start: 2021-04-21 — End: 2021-09-12

## 2021-04-21 MED ORDER — OXYCODONE HCL 5 MG PO TABS: 5.0000 mg | ORAL_TABLET | ORAL | 0 refills | Status: DC | PRN

## 2021-04-21 MED ORDER — CELECOXIB 200 MG PO CAPS
200.0000 mg | ORAL_CAPSULE | Freq: Every day | ORAL | 0 refills | Status: DC
Start: 2021-04-21 — End: 2021-09-12

## 2021-04-21 MED ORDER — ACETAMINOPHEN 325 MG PO TABS: 1000.0000 mg | ORAL_TABLET | Freq: Four times a day (QID) | ORAL | Status: DC

## 2021-04-21 MED ORDER — ASPIRIN 81 MG PO CHEW: 81.0000 mg | CHEWABLE_TABLET | Freq: Two times a day (BID) | ORAL | 0 refills | Status: AC

## 2021-04-21 MED ORDER — POLYETHYLENE GLYCOL 3350 17 G PO PACK: 17.0000 g | PACK | Freq: Every day | ORAL | 0 refills | Status: DC | PRN

## 2021-04-21 MED ORDER — DOCUSATE SODIUM 100 MG PO CAPS
100.0000 mg | ORAL_CAPSULE | Freq: Two times a day (BID) | ORAL | 0 refills | Status: DC
Start: 2021-04-21 — End: 2021-09-12

## 2021-04-21 NOTE — Progress Notes (Addendum)
Physical Therapy Treatment ?Patient Details ?Name: Brenda Lynn ?MRN: [id number] ?DOB: 1961/01/03 ?Today's Date: 04/21/2021 ? ? ?History of Present Illness Pt is a 61 yo female presenting s/p TKR on 04/20/21 for tibial component and polyethylene liner. PMH: OA, hearing loss (L ear), primary Lt TKA in 2017. ? ?  ?PT Comments  ? ? Pt very motivated to participate in therapy today and feel pt is progressing well.. Pt min guard for bed mobility, transfers, and min guard for ambulation with +2 recliner for safety. Pt was able to don pants with min assist for placing garments over BLE and then was able to pull them up in standing. Pt demonstrated increased ability to maintain LLE PWB precautions with decreased cuing. Provided HEP and reviewed yesterday's exercises and provided additional exercises for range of motion/strengthening. Will plan to return for second visit for stair training with crutches when caregiver arrives, as schedule allows. ?   ?Recommendations for follow up therapy are one component of a multi-disciplinary discharge planning process, led by the attending physician.  Recommendations may be updated based on patient status, additional functional criteria and insurance authorization. ? ?Follow Up Recommendations ? Follow physician's recommendations for discharge plan and follow up therapies ?  ?  ?Assistance Recommended at Discharge Set up Supervision/Assistance  ?Patient can return home with the following A little help with walking and/or transfers;A little help with bathing/dressing/bathroom;Assistance with cooking/housework;Assist for transportation;Help with stairs or ramp for entrance ?  ?Equipment Recommendations ? None recommended by PT (Pt has required DME)  ?  ?Recommendations for Other Services   ? ? ?  ?Precautions / Restrictions Precautions ?Precautions: Knee;Fall ?Restrictions ?Weight Bearing Restrictions: Yes ?LLE Weight Bearing: Partial weight bearing ?LLE Partial Weight Bearing Percentage  or Pounds: 50  ?  ? ?Mobility ? Bed Mobility ?Overal bed mobility: Needs Assistance ?Bed Mobility: Supine to Sit ?  ?  ?Supine to sit: HOB elevated, Min guard ?  ?  ?General bed mobility comments: Pt min guard for safety only, no physical assist required other than HOB was eelvated. Upon sitting, pt was able to don underwear and pants with VC for sequencing and min assist for starting the donning process over their feet but was able to pull up pants when standing. ?  ? ?Transfers ?Overall transfer level: Needs assistance ?Equipment used: Rolling walker (2 wheels) ?Transfers: Sit to/from Stand ?Sit to Stand: Min guard, Supervision ?  ?  ?  ?  ?  ?General transfer comment: Pt required min guard for safety only, no physical assist required. Pt able to pull up pants in standing without UE support. ?  ? ?Ambulation/Gait ?Ambulation/Gait assistance: Min guard, +2 safety/equipment ?Gait Distance (Feet): 220 Feet ?Assistive device: Rolling walker (2 wheels) ?Gait Pattern/deviations: Decreased stance time - left, Decreased stride length, Decreased weight shift to left, Step-through pattern, Decreased step length - left ?Gait velocity: decreased ?  ?  ?General Gait Details: Pt required min guard with no physical assist required, +2 for recliner follow for safety. Pt ambulated with step through pattern, weight shifted to RLE and BUEs, decreased step length on L and managed RW well. No overt LOB. Pt was able to maintain PWB on LLE for majority of time with minimal VCs. ? ? ?Stairs ?  ?  ?  ?  ?  ? ? ?Wheelchair Mobility ?  ? ?Modified Rankin (Stroke Patients Only) ?  ? ? ?  ?Balance Overall balance assessment: Needs assistance ?Sitting-balance support: Feet unsupported, No upper extremity supported ?Sitting balance-Leahy  Scale: Good ?  ?  ?Standing balance support: Bilateral upper extremity supported, During functional activity ?Standing balance-Leahy Scale: Fair ?Standing balance comment: Pt reliant on BUE support during  static stance and functional activity secondary to PWB precuations on LLE. ?  ?  ?  ?  ?  ?  ?  ?  ?  ?  ?  ?  ? ?  ?Cognition Arousal/Alertness: Awake/alert ?Behavior During Therapy: [facility] for tasks assessed/performed ?Overall Cognitive Status: Within Functional Limits for tasks assessed ?  ?  ?  ?  ?  ?  ?  ?  ?  ?  ?  ?  ?  ?  ?  ?  ?  ?  ?  ? ?  ?Exercises  ? 04/21/21 0900  ?Total Joint Exercises  ?Ankle Circles/Pumps AROM;Both;15 reps  ?Quad Sets AROM;Left;5 reps  ?Short Arc Quad AROM;Left;10 reps  ?Heel Slides AROM;Left;10 reps  ?Hip ABduction/ADduction AROM;10 reps  ? ?Total Joint Exercises ?Ankle Circles/Pumps: AROM, Both, 15 reps ?Quad Sets: AROM, Left, 5 reps ?Short Arc Quad: AROM, Left, 10 reps ?Heel Slides: AROM, Left, 10 reps ?Hip ABduction/ADduction: AROM, 10 reps ? ?  ?General Comments   ?  ?  ? ?Pertinent Vitals/Pain Pain Assessment ?Pain Score: 2  ?Pain Location: left knee ?Pain Descriptors / Indicators: Guarding, Discomfort  ? ? ?Home Living   ?  ?  ?  ?  ?  ?  ?  ?  ?  ?   ?  ?Prior Function    ?  ?  ?   ? ?PT Goals (current goals can now be found in the care plan section) Acute Rehab PT Goals ?Patient Stated Goal: To get better and go home ?PT Goal Formulation: With patient/family ?Time For Goal Achievement: 04/27/21 ?Potential to Achieve Goals: Good ?Progress towards PT goals: Progressing toward goals ? ?  ?Frequency ? ? ? 7X/week ? ? ? ?  ?PT Plan Current plan remains appropriate  ? ? ?Co-evaluation   ?  ?  ?  ?  ? ?  ?AM-PAC PT "6 Clicks" Mobility   ?Outcome Measure ? Help needed turning from your back to your side while in a flat bed without using bedrails?: A Little ?Help needed moving from lying on your back to sitting on the side of a flat bed without using bedrails?: A Little ?Help needed moving to and from a bed to a chair (including a wheelchair)?: A Little ?Help needed standing up from a chair using your arms (e.g., wheelchair or bedside chair)?: A Little ?Help needed to walk in hospital  room?: A Little ?Help needed climbing 3-5 steps with a railing? : A Little ?6 Click Score: (P) 18 ? ?  ?End of Session Equipment Utilized During Treatment: Gait belt ?Activity Tolerance: Patient tolerated treatment well ?Patient left: in chair;with chair alarm set;with call bell/phone within reach ?Nurse Communication: Mobility status ?PT Visit Diagnosis: Other abnormalities of gait and mobility (R26.89);Difficulty in walking, not elsewhere classified (R26.2) ?  ? ? ?Time: [id number] ?PT Time Calculation (min) (ACUTE ONLY): 18 min ? ?Charges:  $Gait Training: 8-22 mins          ?          ? ?Coolidge Breeze, PT, DPT ?[facility] Department ?Office: [phone] ?Pager: [phone] ? ? ?Coolidge Breeze ?04/21/2021, 11:08 AM ? ?

## 2021-04-21 NOTE — Progress Notes (Signed)
Physical Therapy Treatment ?Clinical Impression ?Pt completed second visit with primary goal of stair training using single crutch and railing. Pt was min guard to supervision for all mobility tasks. Pt and husband completed stair training and demonstrated safe technique with appropriate guarding provided by husband. Pt is progressing well and is able to maintain her PWB precautions. We will continue to follow her acute to promote independence with functional mobility.  ? ? 04/21/21 1108  ?PT Visit Information  ?Last PT Received On 04/21/21  ?Assistance Needed +1  ?History of Present Illness Pt is a 61 yo female presenting s/p TKR on 04/20/21 for tibial component and polyethylene liner. PMH: OA, hearing loss (L ear), primary Lt TKA in 2017.  ?Subjective Data  ?Subjective Im ready to go home  ?Patient Stated Goal To get better and go home  ?Precautions  ?Precautions Knee;Fall  ?Restrictions  ?Weight Bearing Restrictions Yes  ?LLE Weight Bearing PWB  ?LLE Partial Weight Bearing Percentage or Pounds 50  ?Pain Assessment  ?Pain Assessment 0-10  ?Pain Score 0  ?Pain Location left knee  ?Cognition  ?Arousal/Alertness Awake/alert  ?Behavior During Therapy [facility] for tasks assessed/performed  ?Overall Cognitive Status Within Functional Limits for tasks assessed  ?Transfers  ?Overall transfer level Needs assistance  ?Equipment used Rolling walker (2 wheels)  ?Transfers Sit to/from Stand  ?Sit to Stand Min guard;Supervision  ?General transfer comment Pt required min guard for safety only, no physical assist required. Progressed to supervision after initial sit to stand from the recliner. Pt completed sit to stand to/from commode at supervision level. Maintained LLE precuations throughout all transfers.  ?Ambulation/Gait  ?Ambulation/Gait assistance Min guard;Supervision  ?Gait Distance (Feet) 55 Feet  ?Assistive device Rolling walker (2 wheels)  ?Gait Pattern/deviations Decreased stance time - left;Decreased stride  length;Decreased weight shift to left;Step-through pattern;Decreased step length - left  ?General Gait Details Pt required min guard with no physical assist required progressed to supervision toward end of mobility task. Ambulated to bathroom and then out to stairs in hallway. Pt ambulated with step through pattern, weight shifted to RLE and BUEs, decreased step length on L and managed RW well. No overt LOB. Pt's step length, stride length, and fluidity improving. Educated pt on utilizing RW at home.  ?Gait velocity decreased  ?Stairs Yes  ?Stairs assistance Min guard;Supervision  ?Stair Management One rail Right;Step to pattern;Forwards;With crutches ?(Single crutch)  ?Number of Stairs 5  ?General stair comments Pt and husband educated on appropriate/safe stair management with rail and single crutch, handout provided. Demonstrated for pt, and then completed one set of ascent & descent with pt, providing min guard for safety with minimal cuing. Then supervision pt and husband completed second set of stairs, both demonstrated safe technique and good form.  ?Balance  ?Overall balance assessment Needs assistance  ?Sitting-balance support Feet unsupported;No upper extremity supported  ?Sitting balance-Leahy Scale Good  ?Standing balance support Bilateral upper extremity supported;During functional activity  ?Standing balance-Leahy Scale Fair  ?Standing balance comment Pt reliant on BUE support during static stance and functional activity secondary to PWB precuations on LLE.  ?PT - End of Session  ?Equipment Utilized During Treatment Gait belt  ?Activity Tolerance Patient tolerated treatment well  ?Patient left in chair;with chair alarm set;with call bell/phone within reach  ?Nurse Communication Mobility status  ? PT - Assessment/Plan  ?PT Plan Current plan remains appropriate  ?PT Visit Diagnosis Other abnormalities of gait and mobility (R26.89);Difficulty in walking, not elsewhere classified (R26.2)  ?PT Frequency  (ACUTE  ONLY) 7X/week  ?Follow Up Recommendations Follow physician's recommendations for discharge plan and follow up therapies  ?Assistance recommended at discharge Set up Supervision/Assistance  ?Patient can return home with the following A little help with walking and/or transfers;A little help with bathing/dressing/bathroom;Assistance with cooking/housework;Assist for transportation;Help with stairs or ramp for entrance  ?PT equipment None recommended by PT ?(Pt has required DME)  ?AM-PAC PT "6 Clicks" Mobility Outcome Measure (Version 2)  ?Help needed turning from your back to your side while in a flat bed without using bedrails? 3  ?Help needed moving from lying on your back to sitting on the side of a flat bed without using bedrails? 3  ?Help needed moving to and from a bed to a chair (including a wheelchair)? 3  ?Help needed standing up from a chair using your arms (e.g., wheelchair or bedside chair)? 3  ?Help needed to walk in hospital room? 3  ?Help needed climbing 3-5 steps with a railing?  3  ?6 Click Score 18  ?Consider Recommendation of Discharge To: Home with HH  ?Progressive Mobility  ?What is the highest level of mobility based on the progressive mobility assessment? Level 5 (Walks with assist in room/hall) - Balance while stepping forward/back and can walk in room with assist - Complete  ?Activity Ambulated with assistance in hallway;Ambulated with assistance in room;Ambulated with assistance to bathroom  ?Acute Rehab PT Goals  ?PT Goal Formulation With patient/family  ?Time For Goal Achievement 04/27/21  ?Potential to Achieve Goals Good  ?PT Time Calculation  ?PT Start Time (ACUTE ONLY) 1153  ?PT Stop Time (ACUTE ONLY) 1209  ?PT Time Calculation (min) (ACUTE ONLY) 16 min  ?PT General Charges  ?$$ ACUTE PT VISIT 1 Visit  ?PT Treatments  ?$Gait Training 8-22 mins  ? ?Coolidge Breeze, PT, DPT ?[facility] ?Office: [phone] ?Pager: [phone] ? ?

## 2021-04-21 NOTE — Progress Notes (Signed)
? ?  Subjective: ?1 Day Post-Op Procedure(s) (LRB): ?TOTAL KNEE REVISION, POSSIBLE ISOLATED TIBIAL REVISION (Left) ?Patient reports pain as mild.   ?Patient seen in rounds with Dr. Charlann Boxer. ?Patient is well, and has had no acute complaints or problems. No acute events overnight. Foley catheter removed. Patient ambulated 50 feet with PT.  ?We will continue therapy today.  ? ?Objective: ?Vital signs in last 24 hours: ?Temp:  [97.6 ?F (36.4 ?C)-99.6 ?F (37.6 ?C)] 99.6 ?F (37.6 ?C) (03/17 0541) ?Pulse Rate:  [45-134] 59 (03/17 0541) ?Resp:  [10-21] 16 (03/17 0541) ?BP: (92-136)/(60-88) 99/61 (03/17 0541) ?SpO2:  [98 %-100 %] 98 % (03/17 0541) ? ?Intake/Output from previous day: ? ?Intake/Output Summary (Last 24 hours) at 04/21/2021 0750 ?Last data filed at 04/21/2021 0600 ?Gross per 24 hour  ?Intake 4911.02 ml  ?Output 3225 ml  ?Net 1686.02 ml  ?  ? ?Intake/Output this shift: ?No intake/output data recorded. ? ?Labs: ?Recent Labs  ?  04/21/21 ?0318  ?HGB 10.0*  ? ?Recent Labs  ?  04/21/21 ?0318  ?WBC 7.6  ?RBC 3.07*  ?HCT 30.7*  ?PLT 164  ? ?Recent Labs  ?  04/21/21 ?0318  ?NA 140  ?K 4.4  ?CL 107  ?CO2 26  ?BUN 18  ?CREATININE 0.93  ?GLUCOSE 134*  ?CALCIUM 8.4*  ? ?No results for input(s): LABPT, INR in the last 72 hours. ? ?Exam: ?General - Patient is Alert and Oriented ?Extremity - Neurologically intact ?Sensation intact distally ?Intact pulses distally ?Dorsiflexion/Plantar flexion intact ?Dressing - dressing C/D/I ?Motor Function - intact, moving foot and toes well on exam.  ? ?Past Medical History:  ?Diagnosis Date  ? Arthritis   ? osteoarthritis- hands, knees  ? 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 minimal  ? Hernia   ? inguinal hernia at present-asymptomatic.  ? Hx of seasonal allergies   ? Pneumonia   ? hx of years ago  ? PONV (postoperative nausea and vomiting)   ? Varicose veins   ? right leg greater than left- wears compression hose  ? ? ?Assessment/Plan: ?1 Day Post-Op Procedure(s) (LRB): ?TOTAL KNEE REVISION, POSSIBLE  ISOLATED TIBIAL REVISION (Left) ?Principal Problem: ?  S/P revision of total knee, left ? ?Estimated body mass index is 22.64 kg/m? as calculated from the following: ?  Height as of this encounter: 5' 10.5" (1.791 m). ?  Weight as of this encounter: 72.6 kg. ?Advance diet ?Up with therapy ?D/C IV fluids ?  ? ?DVT Prophylaxis - Aspirin ?PWB 50% LLE ? ?Hgb stable at 10 this AM.  ? ?Plan is to go Home after hospital stay. Plan for discharge today following 1-2 sessions of PT as long as they are meeting their goals. Patient is scheduled for OPPT. Follow up in the office in 2 weeks.  ? ?Dennie Bible, PA-C ?Orthopedic Surgery ?[phone] ?04/21/2021, 7:50 AM  ?

## 2021-04-21 NOTE — Progress Notes (Signed)
Orthopedic Tech Progress Note ?Patient Details:  ?Brenda Lynn ?01-Jul-1960 ?[id number] ? ?Ortho Devices ?Type of Ortho Device: Crutches ?Ortho Device/Splint Interventions: Ordered, Application, Adjustment ?  ?  ? ?Delorise Royals Kassi Esteve ?04/21/2021, 9:40 AM ?Delivered and adjusted crutches. ?

## 2021-04-21 NOTE — TOC Transition Note (Signed)
Transition of Care (TOC) - CM/SW Discharge Note ? ? ?Patient Details  ?Name: Brenda Lynn ?MRN: [id number] ?Date of Birth: 07-18-1960 ? ?Transition of Care (TOC) CM/SW Contact:  ?Krystie Leiter, LCSW ?Phone Number: ?04/21/2021, 9:45 AM ? ? ?Clinical Narrative:    ?Met briefly with pt and confirmed she has all needed DME at home.  Plan for OPPT at [facility].  No TOC needs. ? ? ?Final next level of care: OP Rehab ?Barriers to Discharge: No Barriers Identified ? ? ?Patient Goals and CMS Choice ?Patient states their goals for this hospitalization and ongoing recovery are:: return home ?  ?  ? ?Discharge Placement ?  ?           ?  ?  ?  ?  ? ?Discharge Plan and Services ?  ?  ?           ?DME Arranged: N/A ?DME Agency: NA ?  ?  ?  ?  ?  ?  ?  ?  ? ?Social Determinants of Health (SDOH) Interventions ?  ? ? ?Readmission Risk Interventions ?Readmission Risk Prevention Plan 04/21/2021  ?Post Dischage Appt Complete  ?Medication Screening Complete  ?Transportation Screening Complete  ?Some recent data might be hidden  ? ? ? ? ? ?

## 2021-04-21 NOTE — Plan of Care (Signed)
  Problem: Education: Goal: Knowledge of the prescribed therapeutic regimen will improve Outcome: Progressing   Problem: Pain Management: Goal: Pain level will decrease with appropriate interventions Outcome: Progressing   Problem: Activity: Goal: Ability to avoid complications of mobility impairment will improve Outcome: Progressing   

## 2021-04-27 NOTE — Discharge Summary (Signed)
Patient ID: ?Brenda Lynn ?MRN: [id number] ?DOB/AGE: 03/31/1960 61 y.o. ? ?Admit date: 04/20/2021 ?Discharge date: 04/21/2021 ? 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 ? ?Discharge Diagnoses:  ?Principal Problem: ?  S/P revision of total knee, left ? ? ?Past Medical History:  ?Diagnosis Date  ? Arthritis   ? osteoarthritis- hands, knees  ? 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 minimal  ? Hernia   ? inguinal hernia at present-asymptomatic.  ? Hx of seasonal allergies   ? Pneumonia   ? hx of years ago  ? PONV (postoperative nausea and vomiting)   ? Varicose veins   ? right leg greater than left- wears compression hose  ? ? ?Surgeries: Procedure(s): ?TOTAL KNEE REVISION, POSSIBLE ISOLATED TIBIAL REVISION on 04/20/2021 ?  ?Consultants:  ? ?Discharged Condition: Improved ? ?Hospital Course: Brenda Lynn is an 61 y.o. female who was admitted 04/20/2021 for operative treatment ofS/P revision of total knee, left. Patient has severe unremitting pain that affects sleep, daily activities, and work/hobbies. After pre-op clearance the patient was taken to the operating room on 04/20/2021 and underwent  Procedure(s): ?TOTAL KNEE REVISION, POSSIBLE ISOLATED TIBIAL REVISION.   ? ?Patient was given perioperative antibiotics:  ?Anti-infectives (From admission, onward)  ? ? Start     Dose/Rate Route Frequency Ordered Stop  ? 04/20/21 1500  ceFAZolin (ANCEF) IVPB 2g/100 mL premix       ? 2 g ?200 mL/hr over 30 Minutes Intravenous Every 6 hours 04/20/21 1327 04/20/21 2029  ? 04/20/21 0630  ceFAZolin (ANCEF) IVPB 2g/100 mL premix       ? 2 g ?200 mL/hr over 30 Minutes Intravenous On call to O.R. 04/20/21 5852 04/20/21 0904  ? ?  ?  ? ?Patient was given sequential compression devices, early ambulation, and chemoprophylaxis to prevent DVT. Patient worked with PT and was meeting their goals regarding safe ambulation and transfers. ? ?Patient benefited maximally from hospital stay and there were no  complications.   ? ?Recent vital signs: No data found.  ? ?Recent laboratory studies: No results for input(s): WBC, HGB, HCT, PLT, NA, K, CL, CO2, BUN, CREATININE, GLUCOSE, INR, CALCIUM in the last 72 hours. ? ?Invalid input(s): PT, 2 ? ? ?Discharge Medications:   ?Allergies as of 04/21/2021   ? ?   Reactions  ? Demerol [meperidine] Nausea And Vomiting  ? ?  ? ?  ?Medication List  ?  ? ?STOP taking these medications   ? ?ibuprofen 200 MG tablet ?Commonly known as: ADVIL ?  ?sulfamethoxazole-trimethoprim 800-160 MG tablet ?Commonly known as: BACTRIM DS ?  ? ?  ? ?TAKE these medications   ? ?acetaminophen 325 MG tablet ?Commonly known as: TYLENOL ?Take 3 tablets (975 mg total) by mouth every 6 (six) hours. ?  ?aspirin 81 MG chewable tablet ?Chew 1 tablet (81 mg total) by mouth 2 (two) times daily for 28 days. ?  ?CALCIUM 600+D HIGH POTENCY PO ?Take 1 tablet by mouth in the morning. ?  ?celecoxib 200 MG capsule ?Commonly known as: CELEBREX ?Take 1 capsule (200 mg total) by mouth daily. ?  ?docusate sodium 100 MG capsule ?Commonly known as: COLACE ?Take 1 capsule (100 mg total) by mouth 2 (two) times daily. ?  ?estradiol 0.1 MG/GM vaginal cream ?Commonly known as: ESTRACE ?Place 1 Applicatorful vaginally at bedtime. ?  ?fluticasone 50 MCG/ACT nasal spray ?Commonly known as: FLONASE ?Place 1 spray into both nostrils daily as needed for allergies. ?  ?loratadine  10 MG tablet ?Commonly known as: CLARITIN ?Take 10 mg by mouth daily as needed for allergies. ?  ?methocarbamol 500 MG tablet ?Commonly known as: ROBAXIN ?Take 1 tablet (500 mg total) by mouth every 6 (six) hours as needed for muscle spasms. ?  ?multivitamin with minerals Tabs tablet ?Take 1 tablet by mouth in the morning. ?  ?oxyCODONE 5 MG immediate release tablet ?Commonly known as: Oxy IR/ROXICODONE ?Take 1-2 tablets (5-10 mg total) by mouth every 4 (four) hours as needed for severe pain. ?  ?polyethylene glycol 17 g packet ?Commonly known as: MIRALAX /  GLYCOLAX ?Take 17 g by mouth daily as needed for mild constipation. ?  ? ?  ? ?  ?  ? ? ?  ?Discharge Care Instructions  ?(From admission, onward)  ?  ? ? ?  ? ?  Start     Ordered  ? 04/21/21 0000  Change dressing       ?Comments: Maintain surgical dressing until follow up in the clinic. If the edges start to pull up, may reinforce with tape. If the dressing is no longer working, may remove and cover with gauze and tape, but must keep the area dry and clean.  Call with any questions or concerns.  ? 04/21/21 0802  ? ?  ?  ? ?  ? ? ?Diagnostic Studies: No results found. ? ?Disposition: Discharge disposition: 01-Home or Self Care ? ? ? ? ? ? ?Discharge Instructions   ? ? Call MD / Call 911   Complete by: As directed ?  ? If you experience chest pain or shortness of breath, CALL 911 and be transported to the hospital emergency room.  If you develope a fever above 101 F, pus (white drainage) or increased drainage or redness at the wound, or calf pain, call your surgeon's office.  ? Change dressing   Complete by: As directed ?  ? Maintain surgical dressing until follow up in the clinic. If the edges start to pull up, may reinforce with tape. If the dressing is no longer working, may remove and cover with gauze and tape, but must keep the area dry and clean.  Call with any questions or concerns.  ? Constipation Prevention   Complete by: As directed ?  ? Drink plenty of fluids.  Prune juice may be helpful.  You may use a stool softener, such as Colace (over the counter) 100 mg twice a day.  Use MiraLax (over the counter) for constipation as needed.  ? Diet - low sodium heart healthy   Complete by: As directed ?  ? Increase activity slowly as tolerated   Complete by: As directed ?  ? Partial weight bearing with assist device as directed.  ? Post-operative opioid taper instructions:   Complete by: As directed ?  ? POST-OPERATIVE OPIOID TAPER INSTRUCTIONS: ?It is important to wean off of your opioid medication as soon as  possible. If you do not need pain medication after your surgery it is ok to stop day one. ?Opioids include: ?Codeine, Hydrocodone(Norco, Vicodin), Oxycodone(Percocet, oxycontin) and hydromorphone amongst others.  ?Long term and even short term use of opiods can cause: ?Increased pain response ?Dependence ?Constipation ?Depression ?Respiratory depression ?And more.  ?Withdrawal symptoms can include ?Flu like symptoms ?Nausea, vomiting ?And more ?Techniques to manage these symptoms ?Hydrate well ?Eat regular healthy meals ?Stay active ?Use relaxation techniques(deep breathing, meditating, yoga) ?Do Not substitute Alcohol to help with tapering ?If you have been on opioids for less than  two weeks and do not have pain than it is ok to stop all together.  ?Plan to wean off of opioids ?This plan should start within one week post op of your joint replacement. ?Maintain the same interval or time between taking each dose and first decrease the dose.  ?Cut the total daily intake of opioids by one tablet each day ?Next start to increase the time between doses. ?The last dose that should be eliminated is the evening dose.  ? ?  ? TED hose   Complete by: As directed ?  ? Use stockings (TED hose) for 2 weeks on both leg(s).  You may remove them at night for sleeping.  ? ?  ? ? ? Follow-up Information   ? ? Durene Romans, MD. Schedule an appointment as soon as possible for a visit in 2 week(s).   ?Specialty: Orthopedic Surgery ?Contact information: ?[address] ?[address] ?[place] [address] ?[phone] ? ? ?  ?  ? ?  ?  ? ?  ? ? ? ?Signed: ?Cassandria Anger ?04/27/2021, 2:13 PM ? ? ? ?

## 2021-09-11 NOTE — Progress Notes (Unsigned)
New Patient Note  RE: Brenda Lynn MRN: [id number] DOB: 1960-03-28 Date of Office Visit: 09/12/2021  Consult requested by: Melida Quitter, MD Primary care provider: Melida Quitter, MD  Chief Complaint: Urticaria (Has been having hives for several months now. Since Jan/Feb. Says that they are itchy. It is all over the body. )  History of Present Illness: I had the pleasure of seeing Brenda Lynn for initial evaluation at the [facility] on 09/12/2021. She is a 61 y.o. female, who is self-referred here for the evaluation of rash.  Rash started about 8 months ago. This can occur anywhere on her body. Describes them as itchy, red, sometimes raised. Individual rashes sometimes can lasts up to 1 week. No ecchymosis upon resolution. Associated symptoms include: none.  Frequency of episodes: daily. Suspected triggers are unknown. Denies any fevers, chills, changes in medications, foods, personal care products or recent infections. She had left knee revision in March 2023, she had initial done in 2017.   She has tried the following therapies: Claritin, benadryl, clobetasol, triamcinolone, hydroxyzine with no benefit. Systemic steroids 2 rounds which helped. Currently on hydroxyzine 50mg  TID.  Previous work up includes: saw dermatology for this once 6-8 weeks ago, no prior skin biopsy. No recent bloodwork. Previous history of rash/hives: eczema a few years ago. Patient is up to date with the following cancer screening tests: physical exam, pap smear, colonoscopy, pap smears.  Assessment and Plan: Rachyl is a 61 y.o. female with: Rash and other nonspecific skin eruption Pruritic rash started 8 months ago. Can occur anywhere on the body. Some of the rashes can last up to 1 week at a time. Denies changes in diet, meds or personal care products. She had left knee revision in March 2023 but had this issues 2 months beforehand. Tried antihistamines, topical steroid cream with no  benefit. Steroids helped the most. No skin biopsy or recent bloodwork. Patient also seen dermatology. Today's skin prick testing showed: Positive to trees and dust mites. Negative to common foods.  Discussed with patient that today's rash do not look like hives and I do not believe the above allergens are causing the rash either.  Some areas look like eczematous.  See below for proper skin care Use fragrance free and dye free products. No fabric softener or dryer sheets. Moisturize daily.  Stop calamine lotion. Start allegra (fexofenadine) 180mg  twice a day. If symptoms are not controlled or causes drowsiness let April 2023 know. Start pepcid (famotidine) 20mg  twice a day.  May take hydroxyzine 25-50mg  if needed 1 hour before bedtime to help with the itching.  Avoid the following potential triggers: alcohol, tight clothing, NSAIDs, hot showers and getting overheated. Get bloodwork to rule out other etiologies.  If negative and rash is still persistent then recommend skin biopsy next and patch testing (TRUE and metal).   Other allergic rhinitis Denies any significant symptoms. Today's skin prick testing was positive to trees and dust mites. Start environmental control measures as below.  Return in about 4 weeks (around 10/10/2021).  Meds ordered this encounter  Medications   famotidine (PEPCID) 20 MG tablet    Sig: Take 1 tablet (20 mg total) by mouth 2 (two) times daily.    Dispense:  60 tablet    Refill:  2   Lab Orders         ANA w/Reflex         C3 and C4  Chronic Urticaria         CBC with Differential/Platelet         Comprehensive metabolic panel         C-reactive protein         Tryptase         Protein Electrophoresis, Urine Rflx.         Protein electrophoresis, serum         Thyroid Cascade Profile         Alpha-Gal Panel         Sedimentation rate      Other allergy screening: Asthma: no Rhino conjunctivitis: no Food allergy: no Medication allergy:  no Hymenoptera allergy: no History of recurrent infections suggestive of immunodeficency: no  Diagnostics: Skin Testing: Environmental allergy panel and select foods. Positive to trees and dust mites. Negative to common foods.  Results discussed with patient/family.  Airborne Adult Perc - 09/12/21 0930     Time Antigen Placed 0930    Allergen Manufacturer Waynette Buttery    Location Back    Number of Test 59    1. Control-Buffer 50% Glycerol Negative    2. Control-Histamine 1 mg/ml 2+    3. Albumin saline Negative    4. Bahia Negative    5. French Southern Territories Negative    6. Johnson Negative    7. Kentucky Blue Negative    8. Meadow Fescue Negative    9. Perennial Rye Negative    10. Sweet Vernal Negative    11. Timothy Negative    12. Cocklebur Negative    13. Burweed Marshelder Negative    14. Ragweed, short Negative    15. Ragweed, Giant Negative    16. Plantain,  English Negative    17. Lamb's Quarters Negative    18. Sheep Sorrell Negative    19. Rough Pigweed Negative    20. Marsh Elder, Rough Negative    21. Mugwort, Common Negative    22. Ash mix Negative    23. Birch mix 2+    24. Van Clines American --   +/-   25. Box, Elder Negative    26. Cedar, red 3+    27. Cottonwood, Guinea-Bissau Negative    28. Elm mix Negative    29. Hickory 3+    30. Maple mix Negative    31. Oak, Guinea-Bissau mix 3+    32. Pecan Pollen 2+    33. Pine mix Negative    34. Sycamore Eastern Negative    35. Walnut, Black Pollen --   +/-   36. Alternaria alternata Negative    37. Cladosporium Herbarum Negative    38. Aspergillus mix Negative    39. Penicillium mix Negative    40. Bipolaris sorokiniana (Helminthosporium) Negative    41. Drechslera spicifera (Curvularia) Negative    42. Mucor plumbeus Negative    43. Fusarium moniliforme Negative    44. Aureobasidium pullulans (pullulara) Negative    45. Rhizopus oryzae Negative    46. Botrytis cinera Negative    47. Epicoccum nigrum Negative    48. Phoma betae  Negative    49. Candida Albicans Negative    50. Trichophyton mentagrophytes Negative    51. Mite, D Farinae  5,000 AU/ml --   +/-   52. Mite, D Pteronyssinus  5,000 AU/ml 3+    53. Cat Hair 10,000 BAU/ml Negative    54.  Dog Epithelia Negative    55. Mixed Feathers Negative    56. Horse Epithelia Negative  57. Cockroach, German Negative    58. Mouse Negative    59. Tobacco Leaf Negative             Food Perc - 09/12/21 0930       Test Information   Time Antigen Placed 0930    Allergen Manufacturer Waynette Buttery    Location Back    Number of allergen test 10      Food   1. Peanut Negative    2. Soybean food Negative    3. Wheat, whole Negative    4. Sesame Negative    5. Milk, cow Negative    6. Egg White, chicken Negative    7. Casein Negative    8. Shellfish mix Negative    9. Fish mix Negative    10. 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   Hx of seasonal allergies    Pneumonia    hx of years ago   PONV (postoperative nausea and vomiting)    Urticaria    Varicose veins    right leg greater than left- wears compression hose   Past Surgical History: Past Surgical History:  Procedure Laterality Date   ADENOIDECTOMY     APPENDECTOMY  age 61   open   ENDOVENOUS ABLATION SAPHENOUS VEIN W/ LASER Right 12/08/2015   EVLA right greater saphenous vein and stab phlebectomy 10-20 incisions right leg by Gretta Began MD   INGUINAL HERNIA REPAIR Left 05/14/2012   Procedure: HERNIA REPAIR INGUINAL  ADULT;  Surgeon: Lodema Pilot, DO;  Location: [facility];  Service: General;  Laterality: Left;   INSERTION OF MESH Left 05/14/2012   Procedure: INSERTION OF MESH;  Surgeon: Lodema Pilot, DO;  Location: [facility];  Service: General;  Laterality: Left;   neck plastic surgery     to remove birthmark, had keloids radiation tx done age 89   TONSILLECTOMY     TOTAL KNEE ARTHROPLASTY Left 08/01/2015   Procedure: TOTAL LEFT KNEE ARTHROPLASTY;  Surgeon: Durene Romans, MD;  Location: [facility];  Service: Orthopedics;  Laterality: Left;   TOTAL KNEE REVISION Left 04/20/2021   Procedure: TOTAL KNEE REVISION, POSSIBLE ISOLATED TIBIAL REVISION;  Surgeon: Durene Romans, MD;  Location: [facility];  Service: Orthopedics;  Laterality: Left;   TUBAL LIGATION  yrs ago   TYMPANOSTOMY TUBE PLACEMENT  as child   Medication List:  Current Outpatient Medications  Medication Sig Dispense Refill   acetaminophen (TYLENOL) 325 MG tablet Take 3 tablets (975 mg total) by mouth every 6 (six) hours.     calamine lotion Apply 1 Application topically as needed for itching.     clobetasol cream (TEMOVATE) 0.05 % Apply topically 2 (two) times daily.     estradiol (ESTRACE) 0.1 MG/GM vaginal cream Place 1 Applicatorful vaginally at bedtime.     famotidine (PEPCID) 20 MG tablet Take 1 tablet (20 mg total) by mouth 2 (two) times daily. 60  tablet 2   hydrOXYzine (ATARAX) 50 MG tablet Take 50 mg by mouth every 8 (eight) hours as needed.     No current facility-administered medications for this visit.   Allergies: Allergies  Allergen Reactions   Demerol [Meperidine] Nausea And Vomiting   Social History: Social History   Socioeconomic History   Marital status: Married    Spouse name: Not on file   Number of children: Not on file   Years of education: Not on file   Highest education level: Not on file  Occupational History   Not on file  Tobacco Use   Smoking status: Never   Smokeless tobacco: Never  Vaping Use   Vaping Use:  Never used  Substance and Sexual Activity   Alcohol use: No    Comment: rare   Drug use: No   Sexual activity: Not on file  Other Topics Concern   Not on file  Social History Narrative   Not on file   Social Determinants of Health   Financial Resource Strain: Not on file  Food Insecurity: Not on file  Transportation Needs: Not on file  Physical Activity: Not on file  Stress: Not on file  Social Connections: Not on file   Lives in a 62 year old house. Smoking: denies Occupation: Occupational hygienist HistorySurveyor, minerals in the house: no Engineer, civil (consulting) in the family room: yes Carpet in the bedroom: no Heating: gas Cooling: central Pet: yes 3 dogs - 6-7 years  Family History: Family History  Problem Relation Age of Onset   Cancer Father        leukemia   Cancer Paternal Aunt        breast   Cancer Maternal Grandfather        unknown to pt / ?skin   Asthma Son    Allergic rhinitis Neg Hx    Angioedema Neg Hx    Eczema Neg Hx    Urticaria Neg Hx    Review of Systems  Constitutional:  Negative for appetite change, chills, fever and unexpected weight change.  HENT:  Negative for congestion and rhinorrhea.   Eyes:  Negative for itching.  Respiratory:  Negative for cough, chest tightness, shortness of breath and wheezing.   Cardiovascular:  Negative for chest pain.  Gastrointestinal:  Negative for abdominal pain.  Genitourinary:  Negative for difficulty urinating.  Skin:  Positive for rash.  Allergic/Immunologic: Positive for environmental allergies. Negative for food allergies.  Neurological:  Negative for headaches.   Objective: BP 122/70   Pulse 77   Temp 98.2 F (36.8 C)   Resp 16   Ht 5' 10.25" (1.784 m)   Wt 165 lb 12 oz (75.2 kg)   SpO2 98%   BMI 23.61 kg/m  Body mass index is 23.61 kg/m. Physical Exam Vitals and nursing note reviewed.  Constitutional:      Appearance: Normal appearance. She is well-developed.  HENT:     Head: Normocephalic  and atraumatic.     Right Ear: Tympanic membrane and external ear normal.     Left Ear: Tympanic membrane and external ear normal.     Nose: Nose normal.     Mouth/Throat:     Mouth: Mucous membranes are moist.     Pharynx: Oropharynx is clear.  Eyes:     Conjunctiva/sclera: Conjunctivae normal.  Cardiovascular:     Rate and Rhythm: Normal rate and regular rhythm.     Heart sounds: Normal heart sounds. No murmur heard.  No friction rub. No gallop.  Pulmonary:     Effort: Pulmonary effort is normal.     Breath sounds: Normal breath sounds. No wheezing, rhonchi or rales.  Musculoskeletal:     Cervical back: Neck supple.  Skin:    General: Skin is warm and dry.     Findings: Rash present.     Comments: Erythematous patches on the lower back. Venous stasis skin changes on lower extremities b/l.  Neurological:     Mental Status: She is alert and oriented to person, place, and time.  Psychiatric:        Behavior: Behavior normal.   The plan was reviewed with the patient/family, and all questions/concerned were addressed.  It was my pleasure to see Samara DeistKathryn today and participate in her care. Please feel free to contact me with any questions or concerns.  Sincerely,  Wyline MoodYoon Nelma Phagan, DO Allergy & Immunology  [facility] [place] office: [phone] [facility] office: [phone]

## 2021-09-12 ENCOUNTER — Ambulatory Visit: Payer: BC Managed Care – PPO | Admitting: Allergy

## 2021-09-12 ENCOUNTER — Encounter: Payer: Self-pay | Admitting: Allergy

## 2021-09-12 VITALS — BP 122/70 | HR 77 | Temp 98.2°F | Resp 16 | Ht 70.25 in | Wt 165.8 lb

## 2021-09-12 DIAGNOSIS — L299 Pruritus, unspecified: Secondary | ICD-10-CM

## 2021-09-12 DIAGNOSIS — J3089 Other allergic rhinitis: Secondary | ICD-10-CM

## 2021-09-12 DIAGNOSIS — L282 Other prurigo: Secondary | ICD-10-CM | POA: Insufficient documentation

## 2021-09-12 DIAGNOSIS — J302 Other seasonal allergic rhinitis: Secondary | ICD-10-CM | POA: Insufficient documentation

## 2021-09-12 DIAGNOSIS — L209 Atopic dermatitis, unspecified: Secondary | ICD-10-CM | POA: Insufficient documentation

## 2021-09-12 DIAGNOSIS — R21 Rash and other nonspecific skin eruption: Secondary | ICD-10-CM | POA: Diagnosis not present

## 2021-09-12 MED ORDER — FAMOTIDINE 20 MG PO TABS: 20.0000 mg | ORAL_TABLET | Freq: Two times a day (BID) | ORAL | 2 refills | Status: DC

## 2021-09-12 NOTE — Patient Instructions (Addendum)
Today's skin testing showed: Positive to trees and dust mites. Negative to common foods.   Results given.  Rash: Today's rash do not look like hives.  Some areas looks like eczema. See below for proper skin care Use fragrance free and dye free products. No fabric softener or dryer sheets. Moisturize daily.  Stop calamine lotion. Start allegra (fexofenadine) 180mg  twice a day. If symptoms are not controlled or causes drowsiness let know. Start pepcid (famotidine) 20mg  twice a day.  May take hydroxyzine 25-50mg  if needed 1 hour before bedtime to help with the itching.  Avoid the following potential triggers: alcohol, tight clothing, NSAIDs, hot showers and getting overheated. Get bloodwork:  If negative and rash is still persistent then recommend skin biopsy next and patch testing.  We are ordering labs, so please allow 1-2 weeks for the results to come back. With the newly implemented Cures Act, the labs might be visible to you at the same time that they become visible to me. However, I will not address the results until all of the results are back, so please be patient.    Information regarding patch testing:  Patches are best placed on Monday with return to office on Wednesday and Friday of same week for readings.  Patches once placed should not get wet.  You do not have to stop any medications for patch testing but should not be on oral prednisone. You can schedule a patch testing visit when convenient for your schedule.    Environmental allergies Start environmental control measures as below.  Follow up in 4 weeks or sooner if needed.   Skin care recommendations  Bath time: Always use lukewarm water. AVOID very hot or cold water. Keep bathing time to 5-10 minutes. Do NOT use bubble bath. Use a mild soap and use just enough to wash the dirty areas. Do NOT scrub skin vigorously.  After bathing, pat dry your skin with a towel. Do NOT rub or scrub the skin.  Moisturizers  and prescriptions:  ALWAYS apply moisturizers immediately after bathing (within 3 minutes). This helps to lock-in moisture. Use the moisturizer several times a day over the whole body. Good summer moisturizers include: Aveeno, CeraVe, Cetaphil. Good winter moisturizers include: Aquaphor, Vaseline, Cerave, Cetaphil, Eucerin, Vanicream. When using moisturizers along with medications, the moisturizer should be applied about one hour after applying the medication to prevent diluting effect of the medication or moisturize around where you applied the medications. When not using medications, the moisturizer can be continued twice daily as maintenance.  Laundry and clothing: Avoid laundry products with added color or perfumes. Use unscented hypo-allergenic laundry products such as Tide free, Cheer free & gentle, and All free and clear.  If the skin still seems dry or sensitive, you can try double-rinsing the clothes. Avoid tight or scratchy clothing such as wool. Do not use fabric softeners or dyer sheets.   Reducing Pollen Exposure Pollen seasons: trees (spring), grass (summer) and ragweed/weeds (fall). Keep windows closed in your home and car to lower pollen exposure.  Install air conditioning in the bedroom and throughout the house if possible.  Avoid going out in dry windy days - especially early morning. Pollen counts are highest between 5 - 10 AM and on dry, hot and windy days.  Save outside activities for late afternoon or after a heavy rain, when pollen levels are lower.  Avoid mowing of grass if you have grass pollen allergy. Be aware that pollen can also be transported indoors on  people and pets.  Dry your clothes in an automatic dryer rather than hanging them outside where they might collect pollen.  Rinse hair and eyes before bedtime.  Control of House Dust Mite Allergen Dust mite allergens are a common trigger of allergy and asthma symptoms. While they can be found throughout the  house, these microscopic creatures thrive in warm, humid environments such as bedding, upholstered furniture and carpeting. Because so much time is spent in the bedroom, it is essential to reduce mite levels there.  Encase pillows, mattresses, and box springs in special allergen-proof fabric covers or airtight, zippered plastic covers.  Bedding should be washed weekly in hot water (130 F) and dried in a hot dryer. Allergen-proof covers are available for comforters and pillows that can't be regularly washed.  Wash the allergy-proof covers every few months. Minimize clutter in the bedroom. Keep pets out of the bedroom.  Keep humidity less than 50% by using a dehumidifier or air conditioning. You can buy a humidity measuring device called a hygrometer to monitor this.  If possible, replace carpets with hardwood, linoleum, or washable area rugs. If that's not possible, vacuum frequently with a vacuum that has a HEPA filter. Remove all upholstered furniture and non-washable window drapes from the bedroom. Remove all non-washable stuffed toys from the bedroom.  Wash stuffed toys weekly.

## 2021-09-12 NOTE — Assessment & Plan Note (Signed)
Denies any significant symptoms.  Today's skin prick testing was positive to trees and dust mites.  Start environmental control measures as below.

## 2021-09-12 NOTE — Assessment & Plan Note (Signed)
>>  ASSESSMENT AND PLAN FOR PRURITIC RASH WRITTEN ON 09/12/2021  1:18 PM BY Wyline Mood M, DO  Pruritic rash started 8 months ago. Can occur anywhere on the body. Some of the rashes can last up to 1 week at a time. Denies changes in diet, meds or personal care products. She had left knee revision in March 2023 but had this issues 2 months beforehand. Tried antihistamines, topical steroid cream with no benefit. Steroids helped the most. No skin biopsy or recent bloodwork. Patient also seen dermatology.  Today's skin prick testing showed: Positive to trees and dust mites. Negative to common foods.  . Discussed with patient that today's rash do not look like hives and I do not believe the above allergens are causing the rash either.  . Some areas look like eczematous.  . See below for proper skin care o Use fragrance free and dye free products. o No fabric softener or dryer sheets. o Moisturize daily.  o Stop calamine lotion. . Start allegra (fexofenadine) 180mg  twice a day.  If symptoms are not controlled or causes drowsiness let know.  Start pepcid (famotidine) 20mg  twice a day.  . May take hydroxyzine 25-50mg  if needed 1 hour before bedtime to help with the itching.  . Avoid the following potential triggers: alcohol, tight clothing, NSAIDs, hot showers and getting overheated. . Get bloodwork to rule out other etiologies.  o If negative and rash is still persistent then recommend skin biopsy next and patch testing (TRUE and metal).

## 2021-09-12 NOTE — Assessment & Plan Note (Signed)
Pruritic rash started 8 months ago. Can occur anywhere on the body. Some of the rashes can last up to 1 week at a time. Denies changes in diet, meds or personal care products. She had left knee revision in March 2023 but had this issues 2 months beforehand. Tried antihistamines, topical steroid cream with no benefit. Steroids helped the most. No skin biopsy or recent bloodwork. Patient also seen dermatology.  Today's skin prick testing showed: Positive to trees and dust mites. Negative to common foods.  . Discussed with patient that today's rash do not look like hives and I do not believe the above allergens are causing the rash either.  . Some areas look like eczematous.  . See below for proper skin care o Use fragrance free and dye free products. o No fabric softener or dryer sheets. o Moisturize daily.  o Stop calamine lotion. . Start allegra (fexofenadine) 180mg  twice a day.  If symptoms are not controlled or causes drowsiness let know.  Start pepcid (famotidine) 20mg  twice a day.  . May take hydroxyzine 25-50mg  if needed 1 hour before bedtime to help with the itching.  . Avoid the following potential triggers: alcohol, tight clothing, NSAIDs, hot showers and getting overheated. . Get bloodwork to rule out other etiologies.  o If negative and rash is still persistent then recommend skin biopsy next and patch testing (TRUE and metal).

## 2021-09-20 LAB — ALPHA-GAL PANEL
Allergen Lamb IgE: <0.1 kU/L
Beef IgE: <0.1 kU/L
IgE (Immunoglobulin E), Serum: 44 IU/mL (ref 6–495)
O215-IgE Alpha-Gal: <0.1 kU/L
Pork IgE: <0.1 kU/L

## 2021-09-20 LAB — CBC WITH DIFFERENTIAL/PLATELET
Basophils Absolute: 0.1 10*3/uL (ref 0.0–0.2)
Basos: 1 %
EOS (ABSOLUTE): 0.3 10*3/uL (ref 0.0–0.4)
Eos: 8 %
Hematocrit: 41.4 % (ref 34.0–46.6)
Hemoglobin: 13.5 g/dL (ref 11.1–15.9)
Immature Grans (Abs): 0 10*3/uL (ref 0.0–0.1)
Immature Granulocytes: 0 %
Lymphocytes Absolute: 1 10*3/uL (ref 0.7–3.1)
Lymphs: 25 %
MCH: 30.8 pg (ref 26.6–33.0)
MCHC: 32.6 g/dL (ref 31.5–35.7)
MCV: 95 fL (ref 79–97)
Monocytes Absolute: 0.4 10*3/uL (ref 0.1–0.9)
Monocytes: 11 %
Neutrophils Absolute: 2.1 10*3/uL (ref 1.4–7.0)
Neutrophils: 55 %
Platelets: 253 10*3/uL (ref 150–450)
RBC: 4.38 x10E6/uL (ref 3.77–5.28)
RDW: 12.8 % (ref 11.7–15.4)
WBC: 3.9 10*3/uL (ref 3.4–10.8)

## 2021-09-20 LAB — SEDIMENTATION RATE: Sed Rate: 10 mm/hr (ref 0–40)

## 2021-09-20 LAB — TRYPTASE: Tryptase: 6.1 ug/L (ref 2.2–13.2)

## 2021-09-20 LAB — COMPREHENSIVE METABOLIC PANEL
ALT: 17 IU/L (ref 0–32)
AST: 24 IU/L (ref 0–40)
Albumin/Globulin Ratio: 1.6 (ref 1.2–2.2)
Albumin: 4.4 g/dL (ref 3.9–4.9)
Alkaline Phosphatase: 80 IU/L (ref 44–121)
BUN/Creatinine Ratio: 22 (ref 12–28)
BUN: 24 mg/dL (ref 8–27)
Bilirubin Total: 0.8 mg/dL (ref 0.0–1.2)
CO2: 23 mmol/L (ref 20–29)
Calcium: 9.2 mg/dL (ref 8.7–10.3)
Chloride: 99 mmol/L (ref 96–106)
Creatinine, Ser: 1.09 mg/dL — ABNORMAL HIGH (ref 0.57–1.00)
Globulin, Total: 2.7 g/dL (ref 1.5–4.5)
Glucose: 77 mg/dL (ref 70–99)
Potassium: 4.2 mmol/L (ref 3.5–5.2)
Sodium: 144 mmol/L (ref 134–144)
Total Protein: 7.1 g/dL (ref 6.0–8.5)
eGFR: 58 mL/min/{1.73_m2} — ABNORMAL LOW (ref >59)

## 2021-09-20 LAB — PROTEIN ELECTROPHORESIS, SERUM
A/G Ratio: 1.3 (ref 0.7–1.7)
Albumin ELP: 4 g/dL (ref 2.9–4.4)
Alpha 1: 0.2 g/dL (ref 0.0–0.4)
Alpha 2: 0.7 g/dL (ref 0.4–1.0)
Beta: 1 g/dL (ref 0.7–1.3)
Gamma Globulin: 1.1 g/dL (ref 0.4–1.8)
Globulin, Total: 3.1 g/dL (ref 2.2–3.9)

## 2021-09-20 LAB — THYROID CASCADE PROFILE: TSH: 3.13 u[IU]/mL (ref 0.450–4.500)

## 2021-09-20 LAB — C-REACTIVE PROTEIN: CRP: 2 mg/L (ref 0–10)

## 2021-09-20 LAB — CHRONIC URTICARIA: cu index: 5.5 (ref <10)

## 2021-09-20 LAB — PROTEIN ELECTROPHORESIS, URINE REFLEX
Albumin ELP, Urine: 100 %
Alpha-1-Globulin, U: 0 %
Alpha-2-Globulin, U: 0 %
Beta Globulin, U: 0 %
Gamma Globulin, U: 0 %
Protein, Ur: 6.2 mg/dL

## 2021-09-20 LAB — ANA W/REFLEX: Anti Nuclear Antibody (ANA): NEGATIVE

## 2021-09-20 LAB — C3 AND C4
Complement C3, Serum: 152 mg/dL (ref 82–167)
Complement C4, Serum: 35 mg/dL (ref 12–38)

## 2021-10-09 NOTE — Progress Notes (Signed)
Follow Up Note  RE: Brenda Lynn MRN: [id number] DOB: Dec 16, 1960 Date of Office Visit: 10/10/2021  Referring provider: Michael Boston, MD Primary care provider: Michael Boston, MD  Chief Complaint: Rash (Rash has not stopped. ) and Pruritus (Allegra and pepcid have helped with itchy. When she does get itchy it is intense. )  History of Present Illness: I had the pleasure of seeing Brenda Lynn for a follow up visit at the [facility] on 10/10/2021. She is a 61 y.o. female, who is being followed for rash and allergic rhinitis. Her previous allergy office visit was on 09/12/2021 with Dr. Maudie Mercury. Today is a regular follow up visit.  Rash Still having itchy rash all over - slightly improvement with the meds.  Taking allegra 130m BID, famotidine 286mBID and hydroxyzine at night.   Patient concerned about gluten allergy.  Nobody else has this rash/itching at home. Denies insect infestations.  Changed personal care products to dye free and fragrance fee items.   Assessment and Plan: [name] a [age].o. female with: Rash and other nonspecific skin eruption Past history - Pruritic rash started 8 months ago. Can occur anywhere on the body. Some of the rashes can last up to 1 week at a time. Denies changes in diet, meds or personal care products. She had left knee revision in March 2023 but had this issues 2 months beforehand. Tried antihistamines, topical steroid cream with no benefit. Steroids helped the most. No skin biopsy or recent bloodwork. Patient also seen dermatology. 2023 skin prick testing showed: Positive to trees and dust mites. Negative to common foods.  Interim history - 2023 Bloodwork (CBC diff, CMP, TSH, ANA, ESR, CRP, CU, tryptase, Alpha gal, UPEP, SPEP) all normal. Itching slightly improved with meds but still having daily symptoms.  Recommend dermatology visit with skin biopsy next.  Do a trial of gluten free diet for 3-4 weeks and see if symptoms  improve as patient concerned about celiac's - although discussed with patient that unlikely to have developed this in her 6012s Some areas looks like eczema. If skin biopsy consistent with eczema then we can try Dupixent injections next.  Continue proper skin care Start zyrtec (cetirizine) 1061mo 43m54mice a day. See if this works better than alleHuman resources officerf symptoms are not controlled or causes drowsiness let us kKoreaw. Continue Pepcid (famotidine) 43mg52mce a day.  May take hydroxyzine 25-50mg 58meeded 1 hour before bedtime to help with the itching.  Avoid the following potential triggers: alcohol, tight clothing, NSAIDs, hot showers and getting overheated. Consider patch testing as well (TRUE and metal).  Seasonal and perennial allergic rhinitis Past history - 2023 skin prick testing was positive to trees and dust mites. Continue environmental control measures as below.  Return in about 2 months (around 12/10/2021).  No orders of the defined types were placed in this encounter.  Lab Orders  No laboratory test(s) ordered today    Diagnostics: None.   Medication List:  Current Outpatient Medications  Medication Sig Dispense Refill   acetaminophen (TYLENOL) 325 MG tablet Take 3 tablets (975 mg total) by mouth every 6 (six) hours.     clobetasol cream (TEMOVATE) 0.05 % Apply topically 2 (two) times daily.     estradiol (ESTRACE) 0.1 MG/GM vaginal cream Place 1 Applicatorful vaginally at bedtime.     famotidine (PEPCID) 20 MG tablet Take 1 tablet (20 mg total) by mouth 2 (two) times daily. 60Paragon  tablet 2   hydrOXYzine (ATARAX) 50 MG tablet Take 50 mg by mouth every 8 (eight) hours as needed.     No current facility-administered medications for this visit.   Allergies: Allergies  Allergen Reactions   Demerol [Meperidine] Nausea And Vomiting   I reviewed her past medical history, social history, family history, and environmental history and no significant changes have been reported  from her previous visit.  Review of Systems  Constitutional:  Negative for appetite change, chills, fever and unexpected weight change.  HENT:  Negative for congestion and rhinorrhea.   Eyes:  Negative for itching.  Respiratory:  Negative for cough, chest tightness, shortness of breath and wheezing.   Cardiovascular:  Negative for chest pain.  Gastrointestinal:  Negative for abdominal pain.  Genitourinary:  Negative for difficulty urinating.  Skin:  Positive for rash.  Allergic/Immunologic: Positive for environmental allergies. Negative for food allergies.  Neurological:  Negative for headaches.    Objective: BP 122/70   Pulse 92   Temp 98.7 F (37.1 C)   Resp 16   SpO2 97%  There is no height or weight on file to calculate BMI. Physical Exam Vitals and nursing note reviewed.  Constitutional:      Appearance: Normal appearance. She is well-developed.  HENT:     Head: Normocephalic and atraumatic.     Right Ear: Tympanic membrane and external ear normal.     Left Ear: Tympanic membrane and external ear normal.     Nose: Nose normal.     Mouth/Throat:     Mouth: Mucous membranes are moist.     Pharynx: Oropharynx is clear.  Eyes:     Conjunctiva/sclera: Conjunctivae normal.  Cardiovascular:     Rate and Rhythm: Normal rate and regular rhythm.     Heart sounds: Normal heart sounds. No murmur heard.    No friction rub. No gallop.  Pulmonary:     Effort: Pulmonary effort is normal.     Breath sounds: Normal breath sounds. No wheezing, rhonchi or rales.  Musculoskeletal:     Cervical back: Neck supple.  Skin:    General: Skin is warm.     Findings: Rash present.     Comments: Venous stasis skin changes on lower extremities b/l. Scattered excoriations marks on abdominal area, upper extremities b/l.  Neurological:     Mental Status: She is alert and oriented to person, place, and time.  Psychiatric:        Behavior: Behavior normal.    Previous notes and tests were  reviewed. The plan was reviewed with the patient/family, and all questions/concerned were addressed.  It was my pleasure to see Brenda Lynn today and participate in her care. Please feel free to contact me with any questions or concerns.  Sincerely,  Rexene Alberts, DO Allergy & Immunology  [facility] office: [place] office: [phone]

## 2021-10-10 ENCOUNTER — Ambulatory Visit (INDEPENDENT_AMBULATORY_CARE_PROVIDER_SITE_OTHER): Payer: BC Managed Care – PPO | Admitting: Allergy

## 2021-10-10 ENCOUNTER — Encounter: Payer: Self-pay | Admitting: Allergy

## 2021-10-10 VITALS — BP 122/70 | HR 92 | Temp 98.7°F | Resp 16

## 2021-10-10 DIAGNOSIS — R21 Rash and other nonspecific skin eruption: Secondary | ICD-10-CM

## 2021-10-10 DIAGNOSIS — J3089 Other allergic rhinitis: Secondary | ICD-10-CM

## 2021-10-10 DIAGNOSIS — J302 Other seasonal allergic rhinitis: Secondary | ICD-10-CM

## 2021-10-10 NOTE — Assessment & Plan Note (Signed)
>>  ASSESSMENT AND PLAN FOR PRURITIC RASH WRITTEN ON 10/10/2021  4:51 PM BY Ellamae Sia, DO  Past history - Pruritic rash started 8 months ago. Can occur anywhere on the body. Some of the rashes can last up to 1 week at a time. Denies changes in diet, meds or personal care products. She had left knee revision in March 2023 but had this issues 2 months beforehand. Tried antihistamines, topical steroid cream with no benefit. Steroids helped the most. No skin biopsy or recent bloodwork. Patient also seen dermatology. 2023 skin prick testing showed: Positive to trees and dust mites. Negative to common foods.  Interim history - 2023 Bloodwork (CBC diff, CMP, TSH, ANA, ESR, CRP, CU, tryptase, Alpha gal, UPEP, SPEP) all normal. Itching slightly improved with meds but still having daily symptoms.  . Recommend dermatology visit with skin biopsy next.  . Do a trial of gluten free diet for 3-4 weeks and see if symptoms improve as patient concerned about celiac's - although discussed with patient that unlikely to have developed this in her 60s.  . Some areas looks like eczema. o If skin biopsy consistent with eczema then we can try Dupixent injections next.  . Continue proper skin care . Start zyrtec (cetirizine) 10mg  to 20mg  twice a day. o See if this works better than .   If symptoms are not controlled or causes drowsiness let know.  Continue Pepcid (famotidine) 20mg  twice a day.  . May take hydroxyzine 25-50mg  if needed 1 hour before bedtime to help with the itching.  . Avoid the following potential triggers: alcohol, tight clothing, NSAIDs, hot showers and getting overheated. . Consider patch testing as well (TRUE and metal).

## 2021-10-10 NOTE — Assessment & Plan Note (Signed)
Past history - 2023 skin prick testing was positive to trees and dust mites.  Continue environmental control measures as below.

## 2021-10-10 NOTE — Assessment & Plan Note (Signed)
Past history - Pruritic rash started 8 months ago. Can occur anywhere on the body. Some of the rashes can last up to 1 week at a time. Denies changes in diet, meds or personal care products. She had left knee revision in March 2023 but had this issues 2 months beforehand. Tried antihistamines, topical steroid cream with no benefit. Steroids helped the most. No skin biopsy or recent bloodwork. Patient also seen dermatology. 2023 skin prick testing showed: Positive to trees and dust mites. Negative to common foods.  Interim history - 2023 Bloodwork (CBC diff, CMP, TSH, ANA, ESR, CRP, CU, tryptase, Alpha gal, UPEP, SPEP) all normal. Itching slightly improved with meds but still having daily symptoms.  . Recommend dermatology visit with skin biopsy next.  . Do a trial of gluten free diet for 3-4 weeks and see if symptoms improve as patient concerned about celiac's - although discussed with patient that unlikely to have developed this in her 85s.  . Some areas looks like eczema. o If skin biopsy consistent with eczema then we can try Dupixent injections next.  . Continue proper skin care . Start zyrtec (cetirizine) 108m to 245mtwice a day. o See if this works better than alHuman resources officer  If symptoms are not controlled or causes drowsiness let usKoreanow.  Continue Pepcid (famotidine) 204mwice a day.  . May take hydroxyzine 25-1m7m needed 1 hour before bedtime to help with the itching.  . Avoid the following potential triggers: alcohol, tight clothing, NSAIDs, hot showers and getting overheated. . Consider patch testing as well (TRUE and metal).

## 2021-10-10 NOTE — Patient Instructions (Addendum)
Rash: Recommend dermatology visit with skin biopsy next.  Do a trial of gluten free diet for 3-4 weeks and see if symptoms improve.  Some areas looks like eczema. If skin biopsy consistent with eczema then we can try Dupixent injections next.  Continue proper skin care Start zyrtec (cetirizine) 10mg  to 20mg  twice a day. See if this works better than .  If symptoms are not controlled or causes drowsiness let know. Continue Pepcid (famotidine) 20mg  twice a day.  May take hydroxyzine 25-50mg  if needed 1 hour before bedtime to help with the itching.  Avoid the following potential triggers: alcohol, tight clothing, NSAIDs, hot showers and getting overheated.  Information regarding patch testing:  Patches are best placed on Monday with return to office on Wednesday and Friday of same week for readings.  Patches once placed should not get wet.  You do not have to stop any medications for patch testing but should not be on oral prednisone. You can schedule a patch testing visit when convenient for your schedule.    Environmental allergies 2023 skin testing showed: Positive to trees and dust mites. Continue environmental control measures as below.  Follow up in 2 months or sooner if needed.   Skin care recommendations  Bath time: Always use lukewarm water. AVOID very hot or cold water. Keep bathing time to 5-10 minutes. Do NOT use bubble bath. Use a mild soap and use just enough to wash the dirty areas. Do NOT scrub skin vigorously.  After bathing, pat dry your skin with a towel. Do NOT rub or scrub the skin.  Moisturizers and prescriptions:  ALWAYS apply moisturizers immediately after bathing (within 3 minutes). This helps to lock-in moisture. Use the moisturizer several times a day over the whole body. Good summer moisturizers include: Aveeno, CeraVe, Cetaphil. Good winter moisturizers include: Aquaphor, Vaseline, Cerave, Cetaphil, Eucerin, Vanicream. When using moisturizers  along with medications, the moisturizer should be applied about one hour after applying the medication to prevent diluting effect of the medication or moisturize around where you applied the medications. When not using medications, the moisturizer can be continued twice daily as maintenance.  Laundry and clothing: Avoid laundry products with added color or perfumes. Use unscented hypo-allergenic laundry products such as Tide free, Cheer free & gentle, and All free and clear.  If the skin still seems dry or sensitive, you can try double-rinsing the clothes. Avoid tight or scratchy clothing such as wool. Do not use fabric softeners or dyer sheets.   Reducing Pollen Exposure Pollen seasons: trees (spring), grass (summer) and ragweed/weeds (fall). Keep windows closed in your home and car to lower pollen exposure.  Install air conditioning in the bedroom and throughout the house if possible.  Avoid going out in dry windy days - especially early morning. Pollen counts are highest between 5 - 10 AM and on dry, hot and windy days.  Save outside activities for late afternoon or after a heavy rain, when pollen levels are lower.  Avoid mowing of grass if you have grass pollen allergy. Be aware that pollen can also be transported indoors on people and pets.  Dry your clothes in an automatic dryer rather than hanging them outside where they might collect pollen.  Rinse hair and eyes before bedtime.  Control of House Dust Mite Allergen Dust mite allergens are a common trigger of allergy and asthma symptoms. While they can be found throughout the house, these microscopic creatures thrive in warm, humid environments such as bedding, upholstered  furniture and carpeting. Because so much time is spent in the bedroom, it is essential to reduce mite levels there.  Encase pillows, mattresses, and box springs in special allergen-proof fabric covers or airtight, zippered plastic covers.  Bedding should be washed  weekly in hot water (130 F) and dried in a hot dryer. Allergen-proof covers are available for comforters and pillows that can't be regularly washed.  Wash the allergy-proof covers every few months. Minimize clutter in the bedroom. Keep pets out of the bedroom.  Keep humidity less than 50% by using a dehumidifier or air conditioning. You can buy a humidity measuring device called a hygrometer to monitor this.  If possible, replace carpets with hardwood, linoleum, or washable area rugs. If that's not possible, vacuum frequently with a vacuum that has a HEPA filter. Remove all upholstered furniture and non-washable window drapes from the bedroom. Remove all non-washable stuffed toys from the bedroom.  Wash stuffed toys weekly.

## 2021-10-31 ENCOUNTER — Encounter: Payer: Self-pay | Admitting: Allergy

## 2021-10-31 NOTE — Progress Notes (Signed)
Reviewed notes from  [facility] dermat[facility] . Date of service: 10/24/2021. See scanned notes for full documentation. Biopsy report - infiltrates contains lymphocytes, eosinophils and occasional histiocytes. DDX: florid urticaria, papular urticaria, insect bite reactions, drug reactions.

## 2021-11-26 NOTE — Progress Notes (Unsigned)
Follow Up Note  RE: Brenda Lynn MRN: [id number] DOB: 06-08-1960 Date of Office Visit: 11/27/2021  Referring provider: Michael Boston, MD Primary care provider: Michael Boston, MD  Chief Complaint: Rash (Itching/ rash no change in symptoms )  History of Present Illness: I had the pleasure of seeing Brenda Lynn for a follow up visit at the [facility] [place] on 11/27/2021. She is a 61 y.o. female, who is being followed for rash and allergic rhinitis. Her previous allergy office visit was on 10/10/2021 with Dr. Maudie Mercury. Today is a regular follow up visit.  Rash Reviewed biopsy results and bloodwork results.  Patient takes calcium and multivitamin which she stopped with no benefit. She does not take any other medications besides the antihistamines now.  Nobody else has this at home and no insects/bug infestations at home that she is aware of.   Patient was using clobetasol on the back with good benefit. Last application was about 1 week ago. Currently taking hydroxyzine 63m at 3PM and at [facility] Taking zyrtec 116mBID, famotidine 2035mnce a day.   Using aveeno to moisturize.   Tried a gluten free diet and not sure if it helped.   Worse itching on the legs after work.   Assessment and Plan: [name] a 61 39o. female with: Pruritic rash Past history - Pruritic rash started 8 months ago. Can occur anywhere on the body. Some of the rashes can last up to 1 week at a time. Denies changes in diet, meds or personal care products. She had left knee revision in March 2023 but had this issues 2 months beforehand. Tried antihistamines, topical steroid cream with no benefit. Steroids helped the most. Patient also seen dermatology. 2023 skin prick testing showed: Positive to trees and dust mites. Negative to common foods. 2023 Bloodwork (CBC diff, CMP, TSH, ANA, ESR, CRP, CU, tryptase, Alpha gal, UPEP, SPEP) all normal Interim history - 10/24/2021 skin biopsy ( infiltrates contains  lymphocytes, eosinophils and occasional histiocytes. DDX: florid urticaria, papular urticaria, insect bite reactions, drug reactions). Denies insect/bug bite issues, the 2 OTC meds she stopped and restarted as it didn't help. Reviewed bloodwork and biopsy results. 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  Discussed starting Dupixent and handout given.  Start Xyzal (levocetirizine) 5mg72m 10mg64mce a day. See if this works better than zyrtec If symptoms are not controlled or causes drowsiness let us knKorea. Continue Pepcid (famotidine) 20mg 69me a day.  May take hydroxyzine 25-50mg i55meded 1 hour before bedtime to help with the itching.  Avoid the following potential triggers: alcohol, tight clothing, NSAIDs, hot showers and getting overheated. Patch testing for TRUE and metal discussed.   Seasonal and perennial allergic rhinitis Past history - 2023 skin prick testing was positive to trees and dust mites. Continue environmental control measures.  Return in about 3 weeks (around 12/18/2021).  Meds ordered this encounter  Medications   levocetirizine (XYZAL) 5 MG tablet    Sig: Take 1 tablet (5 mg total) by mouth in the morning and at bedtime.    Dispense:  60 tablet    Refill:  2   Lab Orders  No laboratory test(s) ordered today    Diagnostics: None.   Medication List:  Current Outpatient Medications  Medication Sig Dispense Refill   clobetasol cream (TEMOVATE) 0.05 % Apply topically 2 (two) times daily.     estradiol (  ESTRACE) 0.1 MG/GM vaginal cream Place 1 Applicatorful vaginally at bedtime.     famotidine (PEPCID) 20 MG tablet Take 1 tablet (20 mg total) by mouth 2 (two) times daily. 60 tablet 2   hydrOXYzine (ATARAX) 50 MG tablet Take 50 mg by mouth every 8 (eight) hours as needed.     levocetirizine (XYZAL) 5 MG tablet Take 1 tablet (5 mg total) by mouth in the morning and at bedtime. 60 tablet 2    No current facility-administered medications for this visit.   Allergies: Allergies  Allergen Reactions   Demerol [Meperidine] Nausea And Vomiting   I reviewed her past medical history, social history, family history, and environmental history and no significant changes have been reported from her previous visit.  Review of Systems  Constitutional:  Negative for appetite change, chills, fever and unexpected weight change.  HENT:  Negative for congestion and rhinorrhea.   Eyes:  Negative for itching.  Respiratory:  Negative for cough, chest tightness, shortness of breath and wheezing.   Cardiovascular:  Negative for chest pain.  Gastrointestinal:  Negative for abdominal pain.  Genitourinary:  Negative for difficulty urinating.  Skin:  Positive for rash.       itching  Allergic/Immunologic: Positive for environmental allergies. Negative for food allergies.  Neurological:  Negative for headaches.    Objective: BP 132/80   Pulse 85   Temp 98.4 F (36.9 C)   Resp 18   Ht _0  (1.753 m)   Wt 166 lb 3.2 oz (75.4 kg)   SpO2 100%   BMI 24.54 kg/m  Body mass index is 24.54 kg/m. Physical Exam Vitals and nursing note reviewed.  Constitutional:      Appearance: Normal appearance. She is well-developed.  HENT:     Head: Normocephalic and atraumatic.     Right Ear: Tympanic membrane and external ear normal.     Left Ear: Tympanic membrane and external ear normal.     Nose: Nose normal.     Mouth/Throat:     Mouth: Mucous membranes are moist.     Pharynx: Oropharynx is clear.  Eyes:     Conjunctiva/sclera: Conjunctivae normal.  Cardiovascular:     Rate and Rhythm: Normal rate and regular rhythm.     Heart sounds: Normal heart sounds. No murmur heard.    No friction rub. No gallop.  Pulmonary:     Effort: Pulmonary effort is normal.     Breath sounds: Normal breath sounds. No wheezing, rhonchi or rales.  Musculoskeletal:     Cervical back: Neck supple.  Skin:     General: Skin is warm.     Findings: Rash present.     Comments: Venous stasis skin changes on lower extremities b/l. Scattered excoriations marks on lower and upper extremities b/l.  Neurological:     Mental Status: She is alert and oriented to person, place, and time.  Psychiatric:        Behavior: Behavior normal.    Previous notes and tests were reviewed. The plan was reviewed with the patient/family, and all questions/concerned were addressed.  It was my pleasure to see Micheala today and participate in her care. Please feel free to contact me with any questions or concerns.  Sincerely,  Rexene Alberts, DO Allergy & Immunology  [facility] office: [place] office: [phone]

## 2021-11-27 ENCOUNTER — Other Ambulatory Visit: Payer: Self-pay

## 2021-11-27 ENCOUNTER — Encounter: Payer: Self-pay | Admitting: Allergy

## 2021-11-27 ENCOUNTER — Ambulatory Visit (INDEPENDENT_AMBULATORY_CARE_PROVIDER_SITE_OTHER): Payer: BC Managed Care – PPO | Admitting: Allergy

## 2021-11-27 VITALS — BP 132/80 | HR 85 | Temp 98.4°F | Resp 18 | Ht 69.0 in | Wt 166.2 lb

## 2021-11-27 DIAGNOSIS — J3089 Other allergic rhinitis: Secondary | ICD-10-CM

## 2021-11-27 DIAGNOSIS — L282 Other prurigo: Secondary | ICD-10-CM

## 2021-11-27 DIAGNOSIS — J302 Other seasonal allergic rhinitis: Secondary | ICD-10-CM

## 2021-11-27 DIAGNOSIS — R21 Rash and other nonspecific skin eruption: Secondary | ICD-10-CM

## 2021-11-27 MED ORDER — LEVOCETIRIZINE DIHYDROCHLORIDE 5 MG PO TABS
5.0000 mg | ORAL_TABLET | Freq: Two times a day (BID) | ORAL | 2 refills | Status: DC
Start: 2021-11-27 — End: 2022-03-05

## 2021-11-27 NOTE — Assessment & Plan Note (Signed)
>>  ASSESSMENT AND PLAN FOR PRURITIC RASH WRITTEN ON 11/27/2021  1:35 PM BY Garnet Sierras, DO  Past history - Pruritic rash started 8 months ago. Can occur anywhere on the body. Some of the rashes can last up to 1 week at a time. Denies changes in diet, meds or personal care products. She had left knee revision in March 2023 but had this issues 2 months beforehand. Tried antihistamines, topical steroid cream with no benefit. Steroids helped the most. Patient also seen dermatology. 2023 skin prick testing showed: Positive to trees and dust mites. Negative to common foods. 2023 Bloodwork (CBC diff, CMP, TSH, ANA, ESR, CRP, CU, tryptase, Alpha gal, UPEP, SPEP) all normal Interim history - 10/24/2021 skin biopsy ( infiltrates contains lymphocytes, eosinophils and occasional histiocytes. DDX: florid urticaria, papular urticaria, insect bite reactions, drug reactions). Denies insect/bug bite issues, the 2 OTC meds she stopped and restarted as it didn't help.  Reviewed bloodwork and biopsy results.  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   Discussed starting Dupixent and handout given.  . Start Xyzal (levocetirizine) 5mg  to 10mg  twice a day. o See if this works better than zyrtec  If symptoms are not controlled or causes drowsiness let us know.  Continue Pepcid (famotidine) 20mg  twice a day.  . May take hydroxyzine 25-50mg  if needed 1 hour before bedtime to help with the itching.  . Avoid the following potential triggers: alcohol, tight clothing, NSAIDs, hot showers and getting overheated. Marland Kitchen Patch testing for TRUE and metal discussed.

## 2021-11-27 NOTE — Patient Instructions (Addendum)
Rash: Some areas looks like eczema. Read about Dupixent injections  Continue proper skin care Start Xyzal (levocetirizine) 5mg  to 10mg  twice a day. See if this works better than zyrtec If symptoms are not controlled or causes drowsiness let know. Continue Pepcid (famotidine) 20mg  twice a day.  May take hydroxyzine 25-50mg  if needed 1 hour before bedtime to help with the itching.  Avoid the following potential triggers: alcohol, tight clothing, NSAIDs, hot showers and getting overheated.  Information regarding patch testing:  Patches are best placed on Monday with return to office on Wednesday and Friday of same week for readings.  Patches once placed should not get wet.  You do not have to stop any medications for patch testing but should not be on oral prednisone and no topical steroids for 2 weeks. You can schedule a patch testing visit when convenient for your schedule.    Environmental allergies 2023 skin testing showed: Positive to trees and dust mites. Continue environmental control measures as below.  Follow up in 3 weeks or sooner if needed.  Make it for a Monday - we can decide whether to apply patch testing or try Dupixent - will get our coordinator to see if insurance will cover it.   Skin care recommendations  Bath time: Always use lukewarm water. AVOID very hot or cold water. Keep bathing time to 5-10 minutes. Do NOT use bubble bath. Use a mild soap and use just enough to wash the dirty areas. Do NOT scrub skin vigorously.  After bathing, pat dry your skin with a towel. Do NOT rub or scrub the skin.  Moisturizers and prescriptions:  ALWAYS apply moisturizers immediately after bathing (within 3 minutes). This helps to lock-in moisture. Use the moisturizer several times a day over the whole body. Good summer moisturizers include: Aveeno, CeraVe, Cetaphil. Good winter moisturizers include: Aquaphor, Vaseline, Cerave, Cetaphil, Eucerin, Vanicream. When using  moisturizers along with medications, the moisturizer should be applied about one hour after applying the medication to prevent diluting effect of the medication or moisturize around where you applied the medications. When not using medications, the moisturizer can be continued twice daily as maintenance.  Laundry and clothing: Avoid laundry products with added color or perfumes. Use unscented hypo-allergenic laundry products such as Tide free, Cheer free & gentle, and All free and clear.  If the skin still seems dry or sensitive, you can try double-rinsing the clothes. Avoid tight or scratchy clothing such as wool. Do not use fabric softeners or dyer sheets.   Reducing Pollen Exposure Pollen seasons: trees (spring), grass (summer) and ragweed/weeds (fall). Keep windows closed in your home and car to lower pollen exposure.  Install air conditioning in the bedroom and throughout the house if possible.  Avoid going out in dry windy days - especially early morning. Pollen counts are highest between 5 - 10 AM and on dry, hot and windy days.  Save outside activities for late afternoon or after a heavy rain, when pollen levels are lower.  Avoid mowing of grass if you have grass pollen allergy. Be aware that pollen can also be transported indoors on people and pets.  Dry your clothes in an automatic dryer rather than hanging them outside where they might collect pollen.  Rinse hair and eyes before bedtime.  Control of House Dust Mite Allergen Dust mite allergens are a common trigger of allergy and asthma symptoms. While they can be found throughout the house, these microscopic creatures thrive in warm, humid environments such  as bedding, upholstered furniture and [place]. Because so much time is spent in the bedroom, it is essential to reduce mite levels there.  Encase pillows, mattresses, and box springs in special allergen-proof fabric covers or airtight, zippered plastic covers.  Bedding should  be washed weekly in hot water (130 F) and dried in a hot dryer. Allergen-proof covers are available for comforters and pillows that can't be regularly washed.  Wash the allergy-proof covers every few months. Minimize clutter in the bedroom. Keep pets out of the bedroom.  Keep humidity less than 50% by using a dehumidifier or air conditioning. You can buy a humidity measuring device called a hygrometer to monitor this.  If possible, replace carpets with hardwood, linoleum, or washable area rugs. If that's not possible, vacuum frequently with a vacuum that has a HEPA filter. Remove all upholstered furniture and non-washable window drapes from the bedroom. Remove all non-washable stuffed toys from the bedroom.  Wash stuffed toys weekly.

## 2021-11-27 NOTE — Assessment & Plan Note (Signed)
Past history - Pruritic rash started 8 months ago. Can occur anywhere on the body. Some of the rashes can last up to 1 week at a time. Denies changes in diet, meds or personal care products. She had left knee revision in March 2023 but had this issues 2 months beforehand. Tried antihistamines, topical steroid cream with no benefit. Steroids helped the most. Patient also seen dermatology. 2023 skin prick testing showed: Positive to trees and dust mites. Negative to common foods. 2023 Bloodwork (CBC diff, CMP, TSH, ANA, ESR, CRP, CU, tryptase, Alpha gal, UPEP, SPEP) all normal Interim history - 10/24/2021 skin biopsy ( infiltrates contains lymphocytes, eosinophils and occasional histiocytes. DDX: florid urticaria, papular urticaria, insect bite reactions, drug reactions). Denies insect/bug bite issues, the 2 OTC meds she stopped and restarted as it didn't help.  Reviewed bloodwork and biopsy results.  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   Discussed starting Dupixent and handout given.  . Start Xyzal (levocetirizine) 54m to 135mtwice a day. o See if this works better than zyrtec  If symptoms are not controlled or causes drowsiness let usKoreanow.  Continue Pepcid (famotidine) 2076mwice a day.  . May take hydroxyzine 25-10m58m needed 1 hour before bedtime to help with the itching.  . Avoid the following potential triggers: alcohol, tight clothing, NSAIDs, hot showers and getting overheated. . PaMarland Kitchench testing for TRUE and metal discussed.

## 2021-11-27 NOTE — Assessment & Plan Note (Signed)
Past history - 2023 skin prick testing was positive to trees and dust mites.  Continue environmental control measures.

## 2021-11-29 ENCOUNTER — Encounter: Payer: BC Managed Care – PPO | Admitting: Internal Medicine

## 2021-12-01 ENCOUNTER — Encounter: Payer: BC Managed Care – PPO | Admitting: Family Medicine

## 2021-12-12 ENCOUNTER — Ambulatory Visit: Payer: BC Managed Care – PPO | Admitting: Allergy

## 2021-12-13 ENCOUNTER — Encounter: Payer: Self-pay | Admitting: Allergy

## 2021-12-14 ENCOUNTER — Telehealth: Payer: Self-pay | Admitting: *Deleted

## 2021-12-14 NOTE — Telephone Encounter (Signed)
Noted  

## 2021-12-14 NOTE — Telephone Encounter (Signed)
-----   Message from Ellamae Sia, DO sent at 11/27/2021  1:36 PM EDT ----- Can you check if her insurance will approve Dupixent for either eczema or prurigo nodularis? Thank you.

## 2021-12-14 NOTE — Telephone Encounter (Signed)
Called patient and advised approval, copay card and submit for Dupixent to [employer]. Instructed on delivery, storage, and initial injections in clinic with appt

## 2021-12-18 ENCOUNTER — Ambulatory Visit: Payer: BC Managed Care – PPO | Admitting: Allergy

## 2021-12-22 ENCOUNTER — Ambulatory Visit: Payer: BC Managed Care – PPO

## 2021-12-22 DIAGNOSIS — L209 Atopic dermatitis, unspecified: Secondary | ICD-10-CM

## 2021-12-22 NOTE — Progress Notes (Signed)
Patient started Dupixent injections today. Patient received loading dose.  Patient will be doing injections at home every 2 weeks. Teaching was provided and patient self administered injection. Instructions provided and patient verbalized understanding.

## 2022-01-30 ENCOUNTER — Encounter: Payer: Self-pay | Admitting: Allergy

## 2022-01-31 ENCOUNTER — Other Ambulatory Visit: Payer: Self-pay | Admitting: Allergy

## 2022-02-12 ENCOUNTER — Ambulatory Visit (INDEPENDENT_AMBULATORY_CARE_PROVIDER_SITE_OTHER): Payer: BC Managed Care – PPO | Admitting: Family Medicine

## 2022-02-12 ENCOUNTER — Encounter: Payer: Self-pay | Admitting: Family Medicine

## 2022-02-12 ENCOUNTER — Other Ambulatory Visit: Payer: Self-pay

## 2022-02-12 VITALS — BP 130/82 | HR 71 | Temp 98.5°F | Resp 16 | Ht 69.0 in | Wt 169.5 lb

## 2022-02-12 DIAGNOSIS — L235 Allergic contact dermatitis due to other chemical products: Secondary | ICD-10-CM | POA: Diagnosis not present

## 2022-02-12 DIAGNOSIS — L259 Unspecified contact dermatitis, unspecified cause: Secondary | ICD-10-CM | POA: Insufficient documentation

## 2022-02-12 DIAGNOSIS — L209 Atopic dermatitis, unspecified: Secondary | ICD-10-CM | POA: Insufficient documentation

## 2022-02-12 NOTE — Progress Notes (Signed)
Follow-up Note  RE: Brenda Lynn MRN: [id number] DOB: Jun 09, 1960 Date of Office Visit: 02/12/2022  Primary care provider: Michael Boston, MD Referring provider: Michael Boston, MD   Brenda Lynn returns to the office today for the patch test placement, given suspected history of contact dermatitis. She continues with Dupixent injections and reports that the rash has mostly resolved at this time with only occasional areas occurring mainly on her legs. Her current medications are listed in the chart.   Diagnostics: True Test patches placed.   Plan:   Allergic contact dermatitis - Instructions provided on care of the patches for the next 48 hours. Brenda Lynn was instructed to avoid showering for the next 48 hours. Brenda Lynn will follow up in 48 hours and 96 hours for patch readings.    Call the clinic if this treatment plan is not working well for you  Follow up in 2 days or sooner if needed.  Thank you for the opportunity to care for this patient.  Please do not hesitate to contact me with questions.  Gareth Morgan, FNP [facility]

## 2022-02-12 NOTE — Patient Instructions (Addendum)
  Diagnostics: True Test patches placed.    Plan:   Allergic contact dermatitis - Instructions provided on care of the patches for the next 48 hours. Brenda Lynn was instructed to avoid showering for the next 48 hours. Brenda Lynn will follow up in 48 hours and 96 hours for patch readings.    Call the clinic if this treatment plan is not working well for you  Follow up in 2 days or sooner if needed.

## 2022-02-14 ENCOUNTER — Ambulatory Visit: Payer: BC Managed Care – PPO | Admitting: Family

## 2022-02-14 ENCOUNTER — Encounter: Payer: Self-pay | Admitting: Family

## 2022-02-14 DIAGNOSIS — L259 Unspecified contact dermatitis, unspecified cause: Secondary | ICD-10-CM

## 2022-02-14 NOTE — Progress Notes (Signed)
[name] returns to the office today for the 48 hour patch test interpretation, given suspected history of contact dermatitis.    Diagnostics:   TRUE TEST 48-hour hour reading: (+/-) Nickel Sulfate and (+) Gold Sodium Thiosulfate   Plan:   Allergic contact dermatitis - The patient has been provided detailed information regarding the substances she is sensitive to, as well as products containing the substances.   - Meticulous avoidance of these substances is recommended.  - If avoidance is not possible, the use of barrier creams or lotions is recommended. - If symptoms persist or progress despite meticulous avoidance of Nickel Sulfate and Gold Sodium Thiosulfate, Dermatology Referral may be warranted.  Althea Charon, FNP [facility]

## 2022-02-16 ENCOUNTER — Ambulatory Visit: Payer: BC Managed Care – PPO | Admitting: Internal Medicine

## 2022-02-16 DIAGNOSIS — L2389 Allergic contact dermatitis due to other agents: Secondary | ICD-10-CM

## 2022-02-16 NOTE — Progress Notes (Signed)
   Follow Up Note  RE: Dare Spillman MRN: [id number] DOB: 1960/12/19 Date of Office Visit: 02/16/2022  Referring provider: Michael Boston, MD Primary care provider: Michael Boston, MD  History of Present Illness: I had the pleasure of seeing Lu Paradise for a follow up visit at the [facility] on 02/16/2022. She is a 62 y.o. female, who is being followed for contact dermatitis. Today she is here for final patch test interpretation, given suspected history of contact dermatitis.   Diagnostics:   TRUE TEST 48-hour hour reading: (+/-) Nickel Sulfate and (+) Gold Sodium Thiosulfate  TRUE TEST 96 hour reading: (+/-) Nickel Sulfate (+) frangrance mix and (+) Gold Sodium Thiosulfate    Assessment and Plan: Shelby is a 62 y.o. female with: Concern for Contact Dermatitis:  The patient has been provided detailed information regarding the substances she is sensitive to, as well as products containing the substances.  Meticulous avoidance of these substances is recommended. If avoidance is not possible, the use of barrier creams or lotions is recommended. If symptoms persist or progress despite meticulous avoidance of chemicals/substances above, dermatology evaluation may be warranted.  Follow up in 3 months with Dr. Maudie Mercury   It was my pleasure to see Arihana today and participate in her care. Please feel free to contact me with any questions or concerns.  Sincerely,   Roney Marion, MD [facility]

## 2022-02-16 NOTE — Addendum Note (Signed)
Addended by: Hedy Camara L on: 02/16/2022 11:35 AM   Modules accepted: Orders

## 2022-03-04 NOTE — Progress Notes (Unsigned)
Follow Up Note  RE: Brenda Lynn MRN: [id number] DOB: 10-29-1960 Date of Office Visit: 03/05/2022  Referring provider: Michael Boston, MD Primary care provider: Michael Boston, MD  Chief Complaint: No chief complaint on file.  History of Present Illness: I had the pleasure of seeing Brenda Lynn for a follow up visit at the [facility] on 03/04/2022. She is a 62 y.o. female, who is being followed for dermatitis and allergic rhinitis. Her previous allergy office visit was on 02/16/2022 with Dr. Edison Pace. Today is a regular follow up visit.  TRUE TEST 48-hour hour reading: (+/-) Nickel Sulfate and (+) Gold Sodium Thiosulfate  TRUE TEST 96 hour reading: (+/-) Nickel Sulfate (+) frangrance mix and (+) Gold Sodium Thiosulfate   Pruritic rash Past history - Pruritic rash started 8 months ago. Can occur anywhere on the body. Some of the rashes can last up to 1 week at a time. Denies changes in diet, meds or personal care products. She had left knee revision in March 2023 but had this issues 2 months beforehand. Tried antihistamines, topical steroid cream with no benefit. Steroids helped the most. Patient also seen dermatology. 2023 skin prick testing showed: Positive to trees and dust mites. Negative to common foods. 2023 Bloodwork (CBC diff, CMP, TSH, ANA, ESR, CRP, CU, tryptase, Alpha gal, UPEP, SPEP) all normal Interim history - 10/24/2021 skin biopsy ( infiltrates contains lymphocytes, eosinophils and occasional histiocytes. DDX: florid urticaria, papular urticaria, insect bite reactions, drug reactions). Denies insect/bug bite issues, the 2 OTC meds she stopped and restarted as it didn't help. Reviewed bloodwork and biopsy results. 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  Discussed starting Dupixent and handout given.  Start Xyzal (levocetirizine) 5mg  to 10mg  twice a day. See if this works  better than zyrtec If symptoms are not controlled or causes drowsiness let us know. Continue Pepcid (famotidine) 20mg  twice a day.  May take hydroxyzine 25-50mg  if needed 1 hour before bedtime to help with the itching.  Avoid the following potential triggers: alcohol, tight clothing, NSAIDs, hot showers and getting overheated. Patch testing for TRUE and metal discussed.    Seasonal and perennial allergic rhinitis Past history - 2023 skin prick testing was positive to trees and dust mites. Continue environmental control measures.  Assessment and Plan: [name] is a 62 y.o. female with: No problem-specific Assessment & Plan notes found for this encounter.  No follow-ups on file.  No orders of the defined types were placed in this encounter.  Lab Orders  No laboratory test(s) ordered today    Diagnostics: Spirometry:  Tracings reviewed. Her effort: {Blank single:19197::"Good reproducible efforts.","It was hard to get consistent efforts and there is a question as to whether this reflects a maximal maneuver.","Poor effort, data can not be interpreted."} FVC: ***L FEV1: ***L, ***% predicted FEV1/FVC ratio: ***% Interpretation: {Blank single:19197::"Spirometry consistent with mild obstructive disease","Spirometry consistent with moderate obstructive disease","Spirometry consistent with severe obstructive disease","Spirometry consistent with possible restrictive disease","Spirometry consistent with mixed obstructive and restrictive disease","Spirometry uninterpretable due to technique","Spirometry consistent with normal pattern","No overt abnormalities noted given today's efforts"}.  Please see scanned spirometry results for details.  Skin Testing: {Blank single:19197::"Select foods","Environmental allergy panel","Environmental allergy panel and select foods","Food allergy panel","None","Deferred due to recent antihistamines use"}. *** Results discussed with patient/family.   Medication  List:  Current Outpatient Medications  Medication Sig Dispense Refill   amoxicillin (AMOXIL) 500 MG tablet SMARTSIG:4  Tablet(s) By Mouth     Calcium 200 MG TABS calcium     clobetasol cream (TEMOVATE) 0.05 % Apply topically 2 (two) times daily.     DUPIXENT 300 MG/2ML prefilled syringe Inject into the skin.     estradiol (ESTRACE) 0.1 MG/GM vaginal cream Place 1 Applicatorful vaginally at bedtime.     famotidine (PEPCID) 20 MG tablet Take 1 tablet (20 mg total) by mouth 2 (two) times daily. (Patient not taking: Reported on 02/12/2022) 60 tablet 2   hydrOXYzine (ATARAX) 50 MG tablet Take 50 mg by mouth every 8 (eight) hours as needed. (Patient not taking: Reported on 02/12/2022)     levocetirizine (XYZAL) 5 MG tablet Take 1 tablet (5 mg total) by mouth in the morning and at bedtime. 60 tablet 2   No current facility-administered medications for this visit.   Allergies: Allergies  Allergen Reactions   Demerol [Meperidine] Nausea And Vomiting   I reviewed her past medical history, social history, family history, and environmental history and no significant changes have been reported from her previous visit.  Review of Systems  Constitutional:  Negative for appetite change, chills, fever and unexpected weight change.  HENT:  Negative for congestion and rhinorrhea.   Eyes:  Negative for itching.  Respiratory:  Negative for cough, chest tightness, shortness of breath and wheezing.   Cardiovascular:  Negative for chest pain.  Gastrointestinal:  Negative for abdominal pain.  Genitourinary:  Negative for difficulty urinating.  Skin:  Positive for rash.       itching  Allergic/Immunologic: Positive for environmental allergies. Negative for food allergies.  Neurological:  Negative for headaches.    Objective: There were no vitals taken for this visit. There is no height or weight on file to calculate BMI. Physical Exam Vitals and nursing note reviewed.  Constitutional:      Appearance:  Normal appearance. She is well-developed.  HENT:     Head: Normocephalic and atraumatic.     Right Ear: Tympanic membrane and external ear normal.     Left Ear: Tympanic membrane and external ear normal.     Nose: Nose normal.     Mouth/Throat:     Mouth: Mucous membranes are moist.     Pharynx: Oropharynx is clear.  Eyes:     Conjunctiva/sclera: Conjunctivae normal.  Cardiovascular:     Rate and Rhythm: Normal rate and regular rhythm.     Heart sounds: Normal heart sounds. No murmur heard.    No friction rub. No gallop.  Pulmonary:     Effort: Pulmonary effort is normal.     Breath sounds: Normal breath sounds. No wheezing, rhonchi or rales.  Musculoskeletal:     Cervical back: Neck supple.  Skin:    General: Skin is warm.     Findings: Rash present.     Comments: Venous stasis skin changes on lower extremities b/l. Scattered excoriations marks on lower and upper extremities b/l.  Neurological:     Mental Status: She is alert and oriented to person, place, and time.  Psychiatric:        Behavior: Behavior normal.    Previous notes and tests were reviewed. The plan was reviewed with the patient/family, and all questions/concerned were addressed.  It was my pleasure to see Kyndal today and participate in her care. Please feel free to contact me with any questions or concerns.  Sincerely,  Rexene Alberts, DO Allergy & Immunology  [facility] office: [phone]  [place] office: [phone]

## 2022-03-05 ENCOUNTER — Other Ambulatory Visit: Payer: Self-pay

## 2022-03-05 ENCOUNTER — Encounter: Payer: Self-pay | Admitting: Allergy

## 2022-03-05 ENCOUNTER — Ambulatory Visit (INDEPENDENT_AMBULATORY_CARE_PROVIDER_SITE_OTHER): Payer: BC Managed Care – PPO | Admitting: Allergy

## 2022-03-05 VITALS — BP 114/62 | HR 55 | Temp 98.2°F | Resp 18

## 2022-03-05 DIAGNOSIS — J302 Other seasonal allergic rhinitis: Secondary | ICD-10-CM

## 2022-03-05 DIAGNOSIS — L2389 Allergic contact dermatitis due to other agents: Secondary | ICD-10-CM

## 2022-03-05 DIAGNOSIS — J3089 Other allergic rhinitis: Secondary | ICD-10-CM

## 2022-03-05 DIAGNOSIS — L2089 Other atopic dermatitis: Secondary | ICD-10-CM

## 2022-03-05 MED ORDER — LEVOCETIRIZINE DIHYDROCHLORIDE 5 MG PO TABS: 5.0000 mg | ORAL_TABLET | Freq: Two times a day (BID) | ORAL | 1 refills | Status: AC | PRN

## 2022-03-05 NOTE — Assessment & Plan Note (Addendum)
Past history - Pruritic rash started 8 months ago. Can occur anywhere on the body. Some of the rashes can last up to 1 week at a time. Denies changes in diet, meds or personal care products. She had left knee revision in March 2023 but had this issues 2 months beforehand. Tried antihistamines, topical steroid cream with no benefit. Steroids helped the most. No skin biopsy or recent bloodwork. Patient also seen dermatology. 2023 TRUE patch test positive to (+/-) Nickel Sulfate (+) fragrance mix and (+) Gold Sodium Thiosulfate. Started Dupixent on 12/22/2021. Interim history - doing much better with Dupixent. No rash. Minimal itching.  Continue proper skin care. Continue Dupixent injections every 2 weeks at home.  May take Xyzal (levocetirizine) 5mg  1-2 times a day as needed.  Avoid the following potential triggers: alcohol, tight clothing, NSAIDs, hot showers and getting overheated. Avoid items that were positive on patch testing. Safe list sent.

## 2022-03-05 NOTE — Assessment & Plan Note (Signed)
Past history - 2023 skin prick testing was positive to trees and dust mites. Continue environmental control measures. Use over the counter antihistamines such as Zyrtec (cetirizine), Claritin (loratadine), Allegra (fexofenadine), or Xyzal (levocetirizine) daily as needed. May take twice a day during allergy flares. May switch antihistamines every few months.

## 2022-03-05 NOTE — Patient Instructions (Addendum)
Rash: Continue proper skin care. Continue Dupixent injections every 2 weeks at home.  May take Xyzal (levocetirizine) 5mg  1-2 times a day as needed.  Avoid the following potential triggers: alcohol, tight clothing, NSAIDs, hot showers and getting overheated. Will send you safe list via pdf. Avoid items that were positive on patch testing.  TRUE TEST 48-hour hour reading: (+/-) Nickel Sulfate and (+) Gold Sodium Thiosulfate  TRUE TEST 96 hour reading: (+/-) Nickel Sulfate (+) frangrance mix and (+) Gold Sodium Thiosulfate  Environmental allergies 2023 skin testing showed: Positive to trees and dust mites. Continue environmental control measures as below.  Follow up in 6 months or sooner if needed.   Skin care recommendations  Bath time: Always use lukewarm water. AVOID very hot or cold water. Keep bathing time to 5-10 minutes. Do NOT use bubble bath. Use a mild soap and use just enough to wash the dirty areas. Do NOT scrub skin vigorously.  After bathing, pat dry your skin with a towel. Do NOT rub or scrub the skin.  Moisturizers and prescriptions:  ALWAYS apply moisturizers immediately after bathing (within 3 minutes). This helps to lock-in moisture. Use the moisturizer several times a day over the whole body. Good summer moisturizers include: Aveeno, CeraVe, Cetaphil. Good winter moisturizers include: Aquaphor, Vaseline, Cerave, Cetaphil, Eucerin, Vanicream. When using moisturizers along with medications, the moisturizer should be applied about one hour after applying the medication to prevent diluting effect of the medication or moisturize around where you applied the medications. When not using medications, the moisturizer can be continued twice daily as maintenance.  Laundry and clothing: Avoid laundry products with added color or perfumes. Use unscented hypo-allergenic laundry products such as Tide free, Cheer free & gentle, and All free and clear.  If the skin still seems  dry or sensitive, you can try double-rinsing the clothes. Avoid tight or scratchy clothing such as wool. Do not use fabric softeners or dyer sheets.   Reducing Pollen Exposure Pollen seasons: trees (spring), grass (summer) and ragweed/weeds (fall). Keep windows closed in your home and car to lower pollen exposure.  Install air conditioning in the bedroom and throughout the house if possible.  Avoid going out in dry windy days - especially early morning. Pollen counts are highest between 5 - 10 AM and on dry, hot and windy days.  Save outside activities for late afternoon or after a heavy rain, when pollen levels are lower.  Avoid mowing of grass if you have grass pollen allergy. Be aware that pollen can also be transported indoors on people and pets.  Dry your clothes in an automatic dryer rather than hanging them outside where they might collect pollen.  Rinse hair and eyes before bedtime.  Control of House Dust Mite Allergen Dust mite allergens are a common trigger of allergy and asthma symptoms. While they can be found throughout the house, these microscopic creatures thrive in warm, humid environments such as bedding, upholstered furniture and carpeting. Because so much time is spent in the bedroom, it is essential to reduce mite levels there.  Encase pillows, mattresses, and box springs in special allergen-proof fabric covers or airtight, zippered plastic covers.  Bedding should be washed weekly in hot water (130 F) and dried in a hot dryer. Allergen-proof covers are available for comforters and pillows that can't be regularly washed.  Wash the allergy-proof covers every few months. Minimize clutter in the bedroom. Keep pets out of the bedroom.  Keep humidity less than 50% by using  a dehumidifier or air conditioning. You can buy a humidity measuring device called a hygrometer to monitor this.  If possible, replace carpets with hardwood, linoleum, or washable area rugs. If that's not  possible, vacuum frequently with a vacuum that has a HEPA filter. Remove all upholstered furniture and non-washable window drapes from the bedroom. Remove all non-washable stuffed toys from the bedroom.  Wash stuffed toys weekly.

## 2022-05-14 ENCOUNTER — Encounter: Payer: Self-pay | Admitting: Allergy

## 2022-05-21 ENCOUNTER — Other Ambulatory Visit: Payer: Self-pay | Admitting: *Deleted

## 2022-05-21 MED ORDER — DUPIXENT 300 MG/2ML ~~LOC~~ SOSY: 300.0000 mg | PREFILLED_SYRINGE | SUBCUTANEOUS | 11 refills | Status: DC

## 2022-09-02 NOTE — Progress Notes (Unsigned)
Follow Up Note  RE: Brenda Lynn MRN: [id number] DOB: [birth year] Date of Office Visit: 09/03/2022  Referring provider: Melida Quitter, MD Primary care provider: Melida Quitter, MD  Chief Complaint: No chief complaint on file.  History of Present Illness: I had the pleasure of seeing Brenda Lynn for a follow up visit at the [facility] on 09/02/2022. She is a 62 y.o. female, who is being followed for atopic dermatitis on Dupixent and allergic rhinitis. Her previous allergy office visit was on 03/05/2022 with Dr. Selena Batten. Today is a regular follow up visit.  Atopic dermatitis Past history - Pruritic rash started 8 months ago. Can occur anywhere on the body. Some of the rashes can last up to 1 week at a time. Denies changes in diet, meds or personal care products. She had left knee revision in March 2023 but had this issues 2 months beforehand. Tried antihistamines, topical steroid cream with no benefit. Steroids helped the most. No skin biopsy or recent bloodwork. Patient also seen dermatology. 2023 TRUE patch test positive to (+/-) Nickel Sulfate (+) fragrance mix and (+) Gold Sodium Thiosulfate. Started Dupixent on 12/22/2021. Interim history - doing much better with Dupixent. No rash. Minimal itching.  Continue proper skin care. Continue Dupixent injections every 2 weeks at home.  May take Xyzal (levocetirizine) 5mg  1-2 times a day as needed.  Avoid the following potential triggers: alcohol, tight clothing, NSAIDs, hot showers and getting overheated. Avoid items that were positive on patch testing. Safe list sent.    Seasonal and perennial allergic rhinitis Past history - 2023 skin prick testing was positive to trees and dust mites. Continue environmental control measures. Use over the counter antihistamines such as Zyrtec (cetirizine), Claritin (loratadine), Allegra (fexofenadine), or Xyzal (levocetirizine) daily as needed. May take twice a day during allergy flares.  May switch antihistamines every few months.   Return in about 6 months (around 09/03/2022).  Assessment and Plan: Brenda Lynn is a 62 y.o. female with: No problem-specific Assessment & Plan notes found for this encounter.  No follow-ups on file.  No orders of the defined types were placed in this encounter.  Lab Orders  No laboratory test(s) ordered today    Diagnostics: Spirometry:  Tracings reviewed. Her effort: {Blank single:19197::"Good reproducible efforts.","It was hard to get consistent efforts and there is a question as to whether this reflects a maximal maneuver.","Poor effort, data can not be interpreted."} FVC: ***L FEV1: ***L, ***% predicted FEV1/FVC ratio: ***% Interpretation: {Blank single:19197::"Spirometry consistent with mild obstructive disease","Spirometry consistent with moderate obstructive disease","Spirometry consistent with severe obstructive disease","Spirometry consistent with possible restrictive disease","Spirometry consistent with mixed obstructive and restrictive disease","Spirometry uninterpretable due to technique","Spirometry consistent with normal pattern","No overt abnormalities noted given today's efforts"}.  Please see scanned spirometry results for details.  Skin Testing: {Blank single:19197::"Select foods","Environmental allergy panel","Environmental allergy panel and select foods","Food allergy panel","None","Deferred due to recent antihistamines use"}. *** Results discussed with patient/family.   Medication List:  Current Outpatient Medications  Medication Sig Dispense Refill   Calcium 200 MG TABS calcium     clobetasol cream (TEMOVATE) 0.05 % Apply topically 2 (two) times daily.     DUPIXENT 300 MG/2ML prefilled syringe Inject 300 mg into the skin every 14 (fourteen) days. 4 mL 11   estradiol (ESTRACE) 0.1 MG/GM vaginal cream Place 1 Applicatorful vaginally at bedtime.     levocetirizine (XYZAL) 5 MG tablet Take 1 tablet (5 mg total) by mouth  2 (two) times daily as needed.  180 tablet 1   No current facility-administered medications for this visit.   Allergies: Allergies  Allergen Reactions   Demerol [Meperidine] Nausea And Vomiting   I reviewed her past medical history, social history, family history, and environmental history and no significant changes have been reported from her previous visit.  Review of Systems  Constitutional:  Negative for appetite change, chills, fever and unexpected weight change.  HENT:  Negative for congestion and rhinorrhea.   Eyes:  Negative for itching.  Respiratory:  Negative for cough, chest tightness, shortness of breath and wheezing.   Cardiovascular:  Negative for chest pain.  Gastrointestinal:  Negative for abdominal pain.  Genitourinary:  Negative for difficulty urinating.  Skin:  Negative for rash.  Allergic/Immunologic: Positive for environmental allergies. Negative for food allergies.  Neurological:  Negative for headaches.    Objective: There were no vitals taken for this visit. There is no height or weight on file to calculate BMI. Physical Exam Vitals and nursing note reviewed.  Constitutional:      Appearance: Normal appearance. She is well-developed.  HENT:     Head: Normocephalic and atraumatic.     Ears:     Comments: Hearing aids    Nose: Nose normal.     Mouth/Throat:     Mouth: Mucous membranes are moist.     Pharynx: Oropharynx is clear.  Eyes:     Conjunctiva/sclera: Conjunctivae normal.  Cardiovascular:     Rate and Rhythm: Normal rate and regular rhythm.     Heart sounds: Normal heart sounds. No murmur heard.    No friction rub. No gallop.  Pulmonary:     Effort: Pulmonary effort is normal.     Breath sounds: Normal breath sounds. No wheezing, rhonchi or rales.  Musculoskeletal:     Cervical back: Neck supple.  Skin:    General: Skin is warm.     Findings: No rash.  Neurological:     Mental Status: She is alert and oriented to person, place, and  time.  Psychiatric:        Behavior: Behavior normal.    Previous notes and tests were reviewed. The plan was reviewed with the patient/family, and all questions/concerned were addressed.  It was my pleasure to see Regana today and participate in her care. Please feel free to contact me with any questions or concerns.  Sincerely,  Wyline Mood, DO Allergy & Immunology  [facility] office: [phone] [facility] office: [phone]

## 2022-09-03 ENCOUNTER — Encounter: Payer: Self-pay | Admitting: Allergy

## 2022-09-03 ENCOUNTER — Ambulatory Visit: Payer: BC Managed Care – PPO | Admitting: Allergy

## 2022-09-03 VITALS — BP 114/78 | HR 71 | Temp 98.7°F | Ht 69.09 in | Wt 153.5 lb

## 2022-09-03 DIAGNOSIS — L2389 Allergic contact dermatitis due to other agents: Secondary | ICD-10-CM

## 2022-09-03 DIAGNOSIS — J302 Other seasonal allergic rhinitis: Secondary | ICD-10-CM | POA: Diagnosis not present

## 2022-09-03 DIAGNOSIS — L2089 Other atopic dermatitis: Secondary | ICD-10-CM | POA: Diagnosis not present

## 2022-09-03 DIAGNOSIS — J3089 Other allergic rhinitis: Secondary | ICD-10-CM

## 2022-09-03 NOTE — Assessment & Plan Note (Signed)
Past history - 2023 skin prick testing was positive to trees and dust mites. Continue environmental control measures. Use over the counter antihistamines such as Zyrtec (cetirizine), Claritin (loratadine), Allegra (fexofenadine), or Xyzal (levocetirizine) daily as needed. May take twice a day during allergy flares. May switch antihistamines every few months.

## 2022-09-03 NOTE — Assessment & Plan Note (Signed)
Past history - Pruritic rash started 8 months ago. Can occur anywhere on the body. Some of the rashes can last up to 1 week at a time. Denies changes in diet, meds or personal care products. She had left knee revision in March 2023 but had this issues 2 months beforehand. Tried antihistamines, topical steroid cream with no benefit. Steroids helped the most. No skin biopsy or recent bloodwork. Patient saw dermatology. 2023 TRUE patch test positive to (+/-) Nickel Sulfate (+) fragrance mix and (+) Gold Sodium Thiosulfate. Started Dupixent on 12/22/2021. Interim history - doing much better with Dupixent. No rash/itching. Missed 4 weeks with no flares.  Continue proper skin care. Continue Dupixent injections every 2 weeks at home.  You can reintroduce calcium x 1 week and if no issues reintroduce multivitamin in your diet. Taper for Dupixent. Go to every 3 weeks x 2 times then if no issues: Every 4 weeks x 2 times then if no issues: Every 5 weeks x 2 times the if no issues: Every 6 weeks x 2 times then stop. If you have issues with rash/itching then go back to the dose where asymptomatic.  May take Xyzal (levocetirizine) 5mg  1-2 times a day as needed.  Avoid the following potential triggers: alcohol, tight clothing, NSAIDs, hot showers and getting overheated. Avoid items that were positive on patch testing.

## 2022-09-03 NOTE — Patient Instructions (Addendum)
Rash: Continue proper skin care. Continue Dupixent injections every 2 weeks at home.  You can reintroduce calcium x 1 week and if no issues reintroduce multivitamin in your diet. Taper for Dupixent. Go to every 3 weeks x 2 times then if no issues: Every 4 weeks x 2 times then if no issues: Every 5 weeks x 2 times the if no issues: Every 6 weeks x 2 times then stop.  May take Xyzal (levocetirizine) 5mg  1-2 times a day as needed.  Avoid the following potential triggers: alcohol, tight clothing, NSAIDs, hot showers and getting overheated. Avoid items that were positive on patch testing.  TRUE TEST 48-hour hour reading: (+/-) Nickel Sulfate and (+) Gold Sodium Thiosulfate  TRUE TEST 96 hour reading: (+/-) Nickel Sulfate (+) frangrance mix and (+) Gold Sodium Thiosulfate  Environmental allergies 2023 skin testing showed: Positive to trees and dust mites. Use over the counter antihistamines such as Zyrtec (cetirizine), Claritin (loratadine), Allegra (fexofenadine), or Xyzal (levocetirizine) daily as needed. May switch antihistamines every few months. Continue environmental control measures as below.  Follow up in 6 months or sooner if needed.   Skin care recommendations  Bath time: Always use lukewarm water. AVOID very hot or cold water. Keep bathing time to 5-10 minutes. Do NOT use bubble bath. Use a mild soap and use just enough to wash the dirty areas. Do NOT scrub skin vigorously.  After bathing, pat dry your skin with a towel. Do NOT rub or scrub the skin.  Moisturizers and prescriptions:  ALWAYS apply moisturizers immediately after bathing (within 3 minutes). This helps to lock-in moisture. Use the moisturizer several times a day over the whole body. Good summer moisturizers include: Aveeno, CeraVe, Cetaphil. Good winter moisturizers include: Aquaphor, Vaseline, Cerave, Cetaphil, Eucerin, Vanicream. When using moisturizers along with medications, the moisturizer should be  applied about one hour after applying the medication to prevent diluting effect of the medication or moisturize around where you applied the medications. When not using medications, the moisturizer can be continued twice daily as maintenance.  Laundry and clothing: Avoid laundry products with added color or perfumes. Use unscented hypo-allergenic laundry products such as Tide free, Cheer free & gentle, and All free and clear.  If the skin still seems dry or sensitive, you can try double-rinsing the clothes. Avoid tight or scratchy clothing such as wool. Do not use fabric softeners or dyer sheets.   Reducing Pollen Exposure Pollen seasons: trees (spring), grass (summer) and ragweed/weeds (fall). Keep windows closed in your home and car to lower pollen exposure.  Install air conditioning in the bedroom and throughout the house if possible.  Avoid going out in dry windy days - especially early morning. Pollen counts are highest between 5 - 10 AM and on dry, hot and windy days.  Save outside activities for late afternoon or after a heavy rain, when pollen levels are lower.  Avoid mowing of grass if you have grass pollen allergy. Be aware that pollen can also be transported indoors on people and pets.  Dry your clothes in an automatic dryer rather than hanging them outside where they might collect pollen.  Rinse hair and eyes before bedtime.  Control of House Dust Mite Allergen Dust mite allergens are a common trigger of allergy and asthma symptoms. While they can be found throughout the house, these microscopic creatures thrive in warm, humid environments such as bedding, upholstered furniture and carpeting. Because so much time is spent in the bedroom, it is essential to  reduce mite levels there.  Encase pillows, mattresses, and box springs in special allergen-proof fabric covers or airtight, zippered plastic covers.  Bedding should be washed weekly in hot water (130 F) and dried in a hot  dryer. Allergen-proof covers are available for comforters and pillows that can't be regularly washed.  Wash the allergy-proof covers every few months. Minimize clutter in the bedroom. Keep pets out of the bedroom.  Keep humidity less than 50% by using a dehumidifier or air conditioning. You can buy a humidity measuring device called a hygrometer to monitor this.  If possible, replace carpets with hardwood, linoleum, or washable area rugs. If that's not possible, vacuum frequently with a vacuum that has a HEPA filter. Remove all upholstered furniture and non-washable window drapes from the bedroom. Remove all non-washable stuffed toys from the bedroom.  Wash stuffed toys weekly.

## 2022-12-04 ENCOUNTER — Other Ambulatory Visit: Payer: Self-pay | Admitting: *Deleted

## 2022-12-04 MED ORDER — DUPIXENT 300 MG/2ML ~~LOC~~ SOSY: 300.0000 mg | PREFILLED_SYRINGE | SUBCUTANEOUS | 11 refills | Status: DC

## 2023-03-05 NOTE — Progress Notes (Unsigned)
Follow Up Note  RE: Brenda Lynn MRN: [id number] DOB: [birth year] Date of Office Visit: 03/06/2023  Referring provider: Melida Quitter, MD Primary care provider: Melida Quitter, MD  Chief Complaint: Rash (No issues) and Other (Been off Dupixent for 2 months - would like to stay off the injection )  History of Present Illness: I had the pleasure of seeing Madason Rauls for a follow up visit at the [facility] on 03/06/2023. She is a 63 y.o. female, who is being followed for atopic dermatitis and allergic rhinitis. Her previous allergy office visit was on 09/03/2022 with Dr. Selena Batten. Today is a regular follow up visit.  Discussed the use of AI scribe software for clinical note transcription with the patient, who gave verbal consent to proceed.  The patient presents for follow-up after discontinuing Dupixent for atopic dermatitis.  The patient has been off Dupixent for over two months. Since discontinuing the medication, she has not experienced any flares of atopic dermatitis. Previously, she had breakouts primarily on her back and legs.  Allergy testing identified fragrance, dust mites, and tree pollen as triggers. She manages her condition by avoiding these triggers, particularly fragrances, which cause itching on her back when exposed. No recent issues with dust mites or tree pollen have been noted.  She continues to take an allergy pill daily as a precautionary measure. No new medications, surgeries, or medical diagnoses have been reported since her last visit, except for cataract surgery.   Assessment and Plan: Meghen is a 63 y.o. female with: Atopic dermatitis Contact dermatitis Past history - Pruritic rash started 8 months ago. Can occur anywhere on the body. Some of the rashes can last up to 1 week at a time. Denies changes in diet, meds or personal care products. She had left knee revision in March 2023 but had this issues 2 months beforehand. Tried  antihistamines, topical steroid cream with no benefit. Steroids helped the most. No skin biopsy or recent bloodwork. Patient saw dermatology. 2023 TRUE patch test positive to (+/-) Nickel Sulfate (+) fragrance mix and (+) Gold Sodium Thiosulfate. Started Dupixent on 12/22/2021. Interim history - No flares for at least two months after discontinuing Dupixent. As long as she avoids her triggers, skin is in good control.  Continue proper skin care. May take Xyzal (levocetirizine) 5mg  1-2 times a day as needed.  Avoid the following potential triggers: alcohol, tight clothing, NSAIDs, hot showers and getting overheated. Avoid items that were positive on patch testing.  TRUE TEST 48-hour hour reading: (+/-) Nickel Sulfate and (+) Gold Sodium Thiosulfate  TRUE TEST 96 hour reading: (+/-) Nickel Sulfate (+) fragrance mix and (+) Gold Sodium Thiosulfate   Seasonal and perennial allergic rhinitis Past history - 2023 skin prick testing was positive to trees and dust mites. Continue environmental control measures. Use over the counter antihistamines such as Zyrtec (cetirizine), Claritin (loratadine), Allegra (fexofenadine), or Xyzal (levocetirizine) daily as needed. May take twice a day during allergy flares. May switch antihistamines every few months.   Return if symptoms worsen or fail to improve.  No orders of the defined types were placed in this encounter.  Lab Orders  No laboratory test(s) ordered today   Diagnostics: None.   Medication List:  Current Outpatient Medications  Medication Sig Dispense Refill   levocetirizine (XYZAL) 5 MG tablet Take 1 tablet (5 mg total) by mouth 2 (two) times daily as needed. 180 tablet 1   No current facility-administered medications for this  visit.   Allergies: Allergies  Allergen Reactions   Powder Scent Fragrance Rash   Nickel Rash   Demerol [Meperidine] Nausea And Vomiting   Gold Sodium Thiosulfate Other (See Comments)   I reviewed her past  medical history, social history, family history, and environmental history and no significant changes have been reported from her previous visit.  Review of Systems  Constitutional:  Negative for appetite change, chills, fever and unexpected weight change.  HENT:  Negative for congestion and rhinorrhea.   Eyes:  Negative for itching.  Respiratory:  Negative for cough, chest tightness, shortness of breath and wheezing.   Cardiovascular:  Negative for chest pain.  Gastrointestinal:  Negative for abdominal pain.  Genitourinary:  Negative for difficulty urinating.  Skin:  Negative for rash.  Allergic/Immunologic: Positive for environmental allergies. Negative for food allergies.  Neurological:  Negative for headaches.    Objective: BP 118/80 (BP Location: Right Arm, Patient Position: Sitting, Cuff Size: Normal)   Pulse 60   Temp 98.3 F (36.8 C) (Temporal)   Resp 18   Ht 5' 9.29" (1.76 m)   Wt 156 lb 9.6 oz (71 kg)   SpO2 97%   BMI 22.93 kg/m  Body mass index is 22.93 kg/m. Physical Exam Vitals and nursing note reviewed.  Constitutional:      Appearance: Normal appearance. She is well-developed.  HENT:     Head: Normocephalic and atraumatic.     Nose: Nose normal.  Eyes:     Conjunctiva/sclera: Conjunctivae normal.  Cardiovascular:     Rate and Rhythm: Normal rate and regular rhythm.     Heart sounds: Normal heart sounds. No murmur heard.    No friction rub. No gallop.  Pulmonary:     Effort: Pulmonary effort is normal.     Breath sounds: Normal breath sounds. No wheezing, rhonchi or rales.  Musculoskeletal:     Cervical back: Neck supple.  Skin:    General: Skin is warm.     Findings: No rash.  Neurological:     Mental Status: She is alert and oriented to person, place, and time.  Psychiatric:        Behavior: Behavior normal.    Previous notes and tests were reviewed. The plan was reviewed with the patient/family, and all questions/concerned were addressed.  It  was my pleasure to see Linzey today and participate in her care. Please feel free to contact me with any questions or concerns.  Sincerely,  Wyline Mood, DO Allergy & Immunology  [facility] office: [phone] [facility] office: [phone]

## 2023-03-06 ENCOUNTER — Encounter: Payer: Self-pay | Admitting: Allergy

## 2023-03-06 ENCOUNTER — Other Ambulatory Visit: Payer: Self-pay

## 2023-03-06 ENCOUNTER — Ambulatory Visit: Payer: BC Managed Care – PPO | Admitting: Allergy

## 2023-03-06 VITALS — BP 118/80 | HR 60 | Temp 98.3°F | Resp 18 | Ht 69.29 in | Wt 156.6 lb

## 2023-03-06 DIAGNOSIS — L2089 Other atopic dermatitis: Secondary | ICD-10-CM | POA: Diagnosis not present

## 2023-03-06 DIAGNOSIS — J3089 Other allergic rhinitis: Secondary | ICD-10-CM | POA: Diagnosis not present

## 2023-03-06 DIAGNOSIS — L2389 Allergic contact dermatitis due to other agents: Secondary | ICD-10-CM

## 2023-03-06 DIAGNOSIS — J302 Other seasonal allergic rhinitis: Secondary | ICD-10-CM

## 2023-03-06 NOTE — Patient Instructions (Addendum)
Rash: Continue proper skin care. May take Xyzal (levocetirizine) 5mg  1-2 times a day as needed.  Avoid the following potential triggers: alcohol, tight clothing, NSAIDs, hot showers and getting overheated. Avoid items that were positive on patch testing.  TRUE TEST 48-hour hour reading: (+/-) Nickel Sulfate and (+) Gold Sodium Thiosulfate  TRUE TEST 96 hour reading: (+/-) Nickel Sulfate (+) fragrance mix and (+) Gold Sodium Thiosulfate  Environmental allergies 2023 skin testing positive to trees and dust mites. Use over the counter antihistamines such as Zyrtec (cetirizine), Claritin (loratadine), Allegra (fexofenadine), or Xyzal (levocetirizine) daily as needed. May switch antihistamines every few months. Continue environmental control measures.   Follow up as needed.   Skin care recommendations  Bath time: Always use lukewarm water. AVOID very hot or cold water. Keep bathing time to 5-10 minutes. Do NOT use bubble bath. Use a mild soap and use just enough to wash the dirty areas. Do NOT scrub skin vigorously.  After bathing, pat dry your skin with a towel. Do NOT rub or scrub the skin.  Moisturizers and prescriptions:  ALWAYS apply moisturizers immediately after bathing (within 3 minutes). This helps to lock-in moisture. Use the moisturizer several times a day over the whole body. Good summer moisturizers include: Aveeno, CeraVe, Cetaphil. Good winter moisturizers include: Aquaphor, Vaseline, Cerave, Cetaphil, Eucerin, Vanicream. When using moisturizers along with medications, the moisturizer should be applied about one hour after applying the medication to prevent diluting effect of the medication or moisturize around where you applied the medications. When not using medications, the moisturizer can be continued twice daily as maintenance.  Laundry and clothing: Avoid laundry products with added color or perfumes. Use unscented hypo-allergenic laundry products such as Tide free,  Cheer free & gentle, and All free and clear.  If the skin still seems dry or sensitive, you can try double-rinsing the clothes. Avoid tight or scratchy clothing such as wool. Do not use fabric softeners or dyer sheets.   Reducing Pollen Exposure Pollen seasons: trees (spring), grass (summer) and ragweed/weeds (fall). Keep windows closed in your home and car to lower pollen exposure.  Install air conditioning in the bedroom and throughout the house if possible.  Avoid going out in dry windy days - especially early morning. Pollen counts are highest between 5 - 10 AM and on dry, hot and windy days.  Save outside activities for late afternoon or after a heavy rain, when pollen levels are lower.  Avoid mowing of grass if you have grass pollen allergy. Be aware that pollen can also be transported indoors on people and pets.  Dry your clothes in an automatic dryer rather than hanging them outside where they might collect pollen.  Rinse hair and eyes before bedtime.  Control of House Dust Mite Allergen Dust mite allergens are a common trigger of allergy and asthma symptoms. While they can be found throughout the house, these microscopic creatures thrive in warm, humid environments such as bedding, upholstered furniture and carpeting. Because so much time is spent in the bedroom, it is essential to reduce mite levels there.  Encase pillows, mattresses, and box springs in special allergen-proof fabric covers or airtight, zippered plastic covers.  Bedding should be washed weekly in hot water (130 F) and dried in a hot dryer. Allergen-proof covers are available for comforters and pillows that can't be regularly washed.  Wash the allergy-proof covers every few months. Minimize clutter in the bedroom. Keep pets out of the bedroom.  Keep humidity less than 50%  by using a dehumidifier or air conditioning. You can buy a humidity measuring device called a hygrometer to monitor this.  If possible, replace  carpets with hardwood, linoleum, or washable area rugs. If that's not possible, vacuum frequently with a vacuum that has a HEPA filter. Remove all upholstered furniture and non-washable window drapes from the bedroom. Remove all non-washable stuffed toys from the bedroom.  Wash stuffed toys weekly.

## 2023-10-03 ENCOUNTER — Ambulatory Visit (HOSPITAL_COMMUNITY)
Admission: EM | Admit: 2023-10-03 | Discharge: 2023-10-03 | Disposition: A | Discharge status: Home / Self Care | Attending: Family Medicine | Admitting: Family Medicine

## 2023-10-03 ENCOUNTER — Encounter (HOSPITAL_COMMUNITY): Payer: Self-pay | Admitting: *Deleted

## 2023-10-03 DIAGNOSIS — N3090 Cystitis, unspecified without hematuria: Secondary | ICD-10-CM | POA: Insufficient documentation

## 2023-10-03 LAB — POCT URINALYSIS DIP (MANUAL ENTRY)
Bilirubin, UA: NEGATIVE
Glucose, UA: NEGATIVE mg/dL
Ketones, POC UA: NEGATIVE mg/dL
Nitrite, UA: NEGATIVE
Protein Ur, POC: NEGATIVE mg/dL
Spec Grav, UA: <=1.005 — AB (ref 1.010–1.025)
Urobilinogen, UA: 0.2 U/dL
pH, UA: 6 (ref 5.0–8.0)

## 2023-10-03 MED ORDER — CEPHALEXIN 500 MG PO CAPS: 500.0000 mg | ORAL_CAPSULE | Freq: Two times a day (BID) | ORAL | 0 refills | Status: DC

## 2023-10-03 NOTE — ED Provider Notes (Signed)
 [facility]    ASSESSMENT & PLAN:  1. Cystitis    Begin: Meds ordered this encounter  Medications   cephALEXin  (KEFLEX ) 500 MG capsule    Sig: Take 1 capsule (500 mg total) by mouth 2 (two) times daily.    Dispense:  10 capsule    Refill:  0   No signs of pyelonephritis. Urine culture sent. Will notify patient of any significant results. Ensure proper hydration. Will follow up with her PCP or here if not showing improvement over the next 48 hours, sooner if needed.  Outlined signs and symptoms indicating need for more acute intervention. Patient verbalized understanding. After Visit Summary given.  SUBJECTIVE:  Brenda Lynn is a 63 y.o. female who complains of dysuria, polyuria and urinary urgency; onset 2 days ago. Denies fevers or flank pain. Has been taking AZO and cranberry without much relief.  LMP: No LMP recorded. Patient is postmenopausal.  OBJECTIVE:  Vitals:   10/03/23 1351  BP: 139/75  Pulse: 60  Resp: 18  Temp: 98.2 F (36.8 C)  TempSrc: Oral  SpO2: 98%   General appearance: alert; no distress Psychological: alert and cooperative; normal mood and affect  Labs Reviewed  POCT URINALYSIS DIP (MANUAL ENTRY) - Abnormal; Notable for the following components:      Result Value   Color, UA light yellow (*)    Spec Grav, UA <=1.005 (*)    Blood, UA moderate (*)    Leukocytes, UA Small (1+) (*)    All other components within normal limits  URINE CULTURE    Allergies  Allergen Reactions   Powder Scent Fragrance Rash   Nickel Rash   Demerol [Meperidine] Nausea And Vomiting   Gold Sodium Thiosulfate Other (See Comments)    Past Medical History:  Diagnosis Date   Arthritis    osteoarthritis- hands, knees   Eczema    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   Hx of seasonal allergies    Pneumonia    hx of years ago   PONV (postoperative nausea and vomiting)    Urticaria     Varicose veins    right leg greater than left- wears compression hose   Social History   Socioeconomic History   Marital status: Married    Spouse name: Not on file   Number of children: Not on file   Years of education: Not on file   Highest education level: Not on file  Occupational History   Not on file  Tobacco Use   Smoking status: Never   Smokeless tobacco: Never  Vaping Use   Vaping status: Never Used  Substance and Sexual Activity   Alcohol use: No    Comment: rare   Drug use: No   Sexual activity: Not on file  Other Topics Concern   Not on file  Social History Narrative   Not on file   Social Drivers of Health   Financial Resource Strain: Not on file  Food Insecurity: Not on file  Transportation Needs: Not on file  Physical Activity: Not on file  Stress: Not on file  Social Connections: Not on file  Intimate Partner Violence: Not on file   Family History  Problem Relation Age of Onset   Cancer Father        leukemia   Cancer Paternal Aunt        breast   Cancer Maternal Grandfather  unknown to pt / ?skin   Asthma Son    Allergic rhinitis Neg Hx    Angioedema Neg Hx    Eczema Neg Hx    Urticaria Neg Hx         Rolinda Rogue, MD 10/03/23 1513

## 2023-10-03 NOTE — ED Triage Notes (Signed)
 C/O dysuria, polyuria and urinary urgency onset 2 days ago. Denies fevers or flank pain.  Has been taking AZO and cranberry.

## 2023-10-05 LAB — URINE CULTURE: Culture: 30000 — AB

## 2023-10-08 ENCOUNTER — Ambulatory Visit (HOSPITAL_COMMUNITY): Payer: Self-pay

## 2023-12-02 ENCOUNTER — Encounter (HOSPITAL_COMMUNITY): Payer: Self-pay | Admitting: *Deleted

## 2023-12-02 ENCOUNTER — Ambulatory Visit (HOSPITAL_COMMUNITY)
Admission: EM | Admit: 2023-12-02 | Discharge: 2023-12-02 | Disposition: A | Discharge status: Home / Self Care | Attending: Family Medicine | Admitting: Family Medicine

## 2023-12-02 DIAGNOSIS — N3090 Cystitis, unspecified without hematuria: Secondary | ICD-10-CM | POA: Diagnosis not present

## 2023-12-02 LAB — POCT URINALYSIS DIP (MANUAL ENTRY)
Glucose, UA: 100 mg/dL — AB
Nitrite, UA: POSITIVE — AB
Protein Ur, POC: >=300 mg/dL — AB
Spec Grav, UA: 1.01 (ref 1.010–1.025)
Urobilinogen, UA: 2 U/dL — AB
pH, UA: 5 (ref 5.0–8.0)

## 2023-12-02 MED ORDER — CEPHALEXIN 250 MG PO CAPS: 250.0000 mg | ORAL_CAPSULE | Freq: Three times a day (TID) | ORAL | 0 refills | Status: AC

## 2023-12-02 NOTE — ED Provider Notes (Signed)
 [facility]    CSN: [id number] Arrival date & time: 12/02/23  1258      History   Chief Complaint No chief complaint on file.   HPI Brenda Lynn is a 63 y.o. female.   HPI Here for pain with urination in her suprapubic area along with urinary frequency.  Symptoms began about 2 days ago.  No fever or chills and no vomiting.  She has maybe had a little nausea but she thinks that may be due to taking the AZO.  The AZO has helped the pain a lot.  She has not really had any dysuria.  She is allergic to Demerol.  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   S/P knee replacement 08/01/2015    Past Surgical History:  Procedure Laterality Date   ADENOIDECTOMY     APPENDECTOMY  age 3   open   CATARACT EXTRACTION     ENDOVENOUS ABLATION SAPHENOUS VEIN W/ LASER Right 12/08/2015   EVLA right greater saphenous vein and stab phlebectomy 10-20 incisions right leg by Krystal Doing MD   INGUINAL HERNIA REPAIR Left 05/14/2012   Procedure: HERNIA REPAIR INGUINAL ADULT;  Surgeon: Redell Faith, DO;  Location: [facility];  Service: General;  Laterality: Left;   INSERTION OF MESH Left 05/14/2012   Procedure: INSERTION OF MESH;  Surgeon:  Redell Faith, DO;  Location: [facility];  Service: General;  Laterality: Left;   neck plastic surgery     to remove birthmark, had keloids radiation tx done age 69   TONSILLECTOMY     TOTAL KNEE ARTHROPLASTY Left 08/01/2015   Procedure: TOTAL LEFT KNEE ARTHROPLASTY;  Surgeon: Donnice Car, MD;  Location: [facility];  Service: Orthopedics;  Laterality: Left;   TOTAL KNEE REVISION Left 04/20/2021   Procedure: TOTAL KNEE REVISION, POSSIBLE ISOLATED TIBIAL REVISION;  Surgeon: Car Donnice, MD;  Location: [facility];  Service: Orthopedics;  Laterality: Left;   TUBAL LIGATION  yrs ago   TYMPANOSTOMY TUBE PLACEMENT  as child    OB History   No obstetric history on file.      Home Medications    Prior to Admission medications   Medication Sig Start Date End Date Taking? Authorizing Provider  cephALEXin  (KEFLEX ) 250 MG capsule Take 1 capsule (250 mg total) by mouth 3 (three) times daily for 7 days. 12/02/23 12/09/23 Yes Patric Vanpelt K, MD  b complex vitamins capsule Take 1 capsule by mouth daily.    [provider]  levocetirizine (XYZAL ) 5 MG tablet Take 1 tablet (5 mg total) by mouth 2 (two) times daily as  needed. 03/05/22   Luke Orlan HERO, DO  UNKNOWN TO PATIENT OTC antihistamine    [provider]  ferrous sulfate  325 (65 FE) MG tablet Take 1 tablet (325 mg total) by mouth 3 (three) times daily after meals. Patient not taking: Reported on 12/15/2015 08/01/15 03/08/20  Danella Cough, PA-C    Family History Family History  Problem Relation Age of Onset   Cancer Father        leukemia   Cancer Paternal Aunt        breast   Cancer Maternal Grandfather        unknown to pt / ?skin   Asthma Son    Allergic rhinitis Neg Hx    Angioedema Neg Hx    Eczema Neg Hx    Urticaria Neg Hx     Social History Social History   Tobacco Use   Smoking status: Never   Smokeless tobacco: Never  Vaping Use   Vaping status: Never Used  Substance Use Topics   Alcohol use: No    Comment:  rare   Drug use: No     Allergies   Powder scent fragrance, Nickel, Demerol [meperidine], and Gold sodium thiosulfate   Review of Systems Review of Systems   Physical Exam Triage Vital Signs ED Triage Vitals  Encounter Vitals Group     BP 12/02/23 1355 (!) 142/78     Girls Systolic BP Percentile --      Girls Diastolic BP Percentile --      Boys Systolic BP Percentile --      Boys Diastolic BP Percentile --      Pulse Rate 12/02/23 1355 (!) 105     Resp 12/02/23 1355 16     Temp 12/02/23 1355 98.1 F (36.7 C)     Temp Source 12/02/23 1355 Oral     SpO2 12/02/23 1355 99 %     Weight --      Height --      Head Circumference --      Peak Flow --      Pain Score 12/02/23 1354 6     Pain Loc --      Pain Education --      Exclude from Growth Chart --    No data found.  Updated Vital Signs BP (!) 142/78 (BP Location: Right Arm)   Pulse (!) 105   Temp 98.1 F (36.7 C) (Oral)   Resp 16   SpO2 99%   Visual Acuity Right Eye Distance:   Left Eye Distance:   Bilateral Distance:    Right Eye Near:   Left Eye Near:    Bilateral Near:     Physical Exam   UC Treatments / Results  Labs (all labs ordered are listed, but only abnormal results are displayed) Labs Reviewed  POCT URINALYSIS DIP (MANUAL ENTRY) - Abnormal; Notable for the following components:      Result Value   Color, UA red (*)    Clarity, UA cloudy (*)    Glucose, UA =100 (*)    Bilirubin, UA small (*)    Ketones, POC UA trace (5) (*)    Blood, UA large (*)    Protein Ur, POC >=300 (*)    Urobilinogen, UA 2.0 (*)    Nitrite, UA Positive (*)    Leukocytes, UA Large (3+) (*)    All other components within normal limits  URINE CULTURE    EKG   Radiology No results found.  Procedures Procedures (including critical care time)  Medications Ordered in UC Medications - No data to display  Initial Impression / Assessment and Plan / UC Course  I have reviewed the triage vital signs and  the nursing notes.  Pertinent labs & imaging results that were available during my care of the patient were reviewed by me and considered in my medical decision making (see chart for details).       Urinalysis is abnormal.  This is probably affected by her AZO  Keflex  is sent in to treat the cystitis and urine culture is sent.  Staff will notify her if it looks like the antibiotic needs to be changed.  I have recommended that she go see her primary care since she had UTI symptoms back in August of this year.   Final Clinical Impressions(s) / UC Diagnoses   Final diagnoses:  Cystitis     Discharge Instructions      Your urinalysis is abnormal with some white blood cells and red blood cells, but this is affected some by the AZO that you have been taking.  Your symptoms I think are consistent with a bladder infection.  Take cephalexin  250 mg--1 capsule 3 times daily for 7 days  You can continue to take AZO as needed.  Urine culture is sent, and staff will notify you that it looks like the antibiotic needs to be changed.  Please follow-up with your primary care since you have had this bother you twice in the last 3 months.     ED Prescriptions     Medication Sig Dispense Auth. Provider   cephALEXin  (KEFLEX ) 250 MG capsule Take 1 capsule (250 mg total) by mouth 3 (three) times daily for 7 days. 21 capsule Hermenegildo Clausen K, MD      PDMP not reviewed this encounter.   Vonna Sharlet POUR, MD 12/02/23 [phone]

## 2023-12-02 NOTE — Discharge Instructions (Signed)
 Your urinalysis is abnormal with some white blood cells and red blood cells, but this is affected some by the AZO that you have been taking.  Your symptoms I think are consistent with a bladder infection.  Take cephalexin  250 mg--1 capsule 3 times daily for 7 days  You can continue to take AZO as needed.  Urine culture is sent, and staff will notify you that it looks like the antibiotic needs to be changed.  Please follow-up with your primary care since you have had this bother you twice in the last 3 months.

## 2023-12-02 NOTE — ED Triage Notes (Signed)
 Pt states she has lower abdominal pain mostly after urinating, increased frequency X 2 days. She is taking AZO

## 2023-12-03 LAB — URINE CULTURE: Culture: NO GROWTH

## 2023-12-04 ENCOUNTER — Ambulatory Visit (HOSPITAL_COMMUNITY): Payer: Self-pay
